# Patient Record
Sex: Female | Born: 1950 | Race: White | Hispanic: No | Marital: Married | Smoking: Former smoker
Health system: Southern US, Community
[De-identification: ages and names within clinical notes are randomized; demographics above are authoritative.]

## PROBLEM LIST (undated history)

## (undated) DIAGNOSIS — A419 Sepsis, unspecified organism: Secondary | ICD-10-CM

## (undated) DIAGNOSIS — J9620 Acute and chronic respiratory failure, unspecified whether with hypoxia or hypercapnia: Secondary | ICD-10-CM

## (undated) DIAGNOSIS — F329 Major depressive disorder, single episode, unspecified: Secondary | ICD-10-CM

## (undated) DIAGNOSIS — F32A Depression, unspecified: Secondary | ICD-10-CM

## (undated) DIAGNOSIS — J449 Chronic obstructive pulmonary disease, unspecified: Secondary | ICD-10-CM

## (undated) DIAGNOSIS — J9610 Chronic respiratory failure, unspecified whether with hypoxia or hypercapnia: Secondary | ICD-10-CM

## (undated) DIAGNOSIS — I10 Essential (primary) hypertension: Secondary | ICD-10-CM

## (undated) DIAGNOSIS — F419 Anxiety disorder, unspecified: Secondary | ICD-10-CM

## (undated) DIAGNOSIS — J189 Pneumonia, unspecified organism: Secondary | ICD-10-CM

## (undated) HISTORY — PX: ABDOMINAL HYSTERECTOMY: SHX81

---

## 2007-12-17 ENCOUNTER — Encounter: Payer: Self-pay | Admitting: Internal Medicine

## 2008-12-01 ENCOUNTER — Encounter: Payer: Self-pay | Admitting: Internal Medicine

## 2009-01-03 ENCOUNTER — Encounter: Payer: Self-pay | Admitting: Internal Medicine

## 2009-01-08 ENCOUNTER — Encounter: Payer: Self-pay | Admitting: Internal Medicine

## 2009-01-25 DIAGNOSIS — I10 Essential (primary) hypertension: Secondary | ICD-10-CM | POA: Insufficient documentation

## 2009-01-25 DIAGNOSIS — J441 Chronic obstructive pulmonary disease with (acute) exacerbation: Secondary | ICD-10-CM | POA: Insufficient documentation

## 2009-01-26 ENCOUNTER — Ambulatory Visit: Payer: Self-pay | Admitting: Internal Medicine

## 2009-03-07 ENCOUNTER — Ambulatory Visit: Payer: Self-pay | Admitting: Internal Medicine

## 2010-05-02 ENCOUNTER — Telehealth (INDEPENDENT_AMBULATORY_CARE_PROVIDER_SITE_OTHER): Payer: Self-pay | Admitting: *Deleted

## 2010-05-11 NOTE — Progress Notes (Signed)
  Phone Note Other Incoming   Request: Send information Summary of Call: Request for records received from [facility]. Request forwarded to [facility].     

## 2011-03-02 ENCOUNTER — Emergency Department (HOSPITAL_COMMUNITY)
Admission: EM | Admit: 2011-03-02 | Discharge: 2011-03-02 | Disposition: A | Discharge status: Home / Self Care | Attending: Emergency Medicine | Admitting: Emergency Medicine

## 2011-03-02 ENCOUNTER — Encounter: Payer: Self-pay | Admitting: Emergency Medicine

## 2011-03-02 DIAGNOSIS — J441 Chronic obstructive pulmonary disease with (acute) exacerbation: Secondary | ICD-10-CM

## 2011-03-02 DIAGNOSIS — R0602 Shortness of breath: Secondary | ICD-10-CM | POA: Insufficient documentation

## 2011-03-02 DIAGNOSIS — R0609 Other forms of dyspnea: Secondary | ICD-10-CM | POA: Insufficient documentation

## 2011-03-02 DIAGNOSIS — F17200 Nicotine dependence, unspecified, uncomplicated: Secondary | ICD-10-CM | POA: Insufficient documentation

## 2011-03-02 DIAGNOSIS — J3489 Other specified disorders of nose and nasal sinuses: Secondary | ICD-10-CM | POA: Insufficient documentation

## 2011-03-02 DIAGNOSIS — R093 Abnormal sputum: Secondary | ICD-10-CM | POA: Insufficient documentation

## 2011-03-02 DIAGNOSIS — R0989 Other specified symptoms and signs involving the circulatory and respiratory systems: Secondary | ICD-10-CM | POA: Insufficient documentation

## 2011-03-02 DIAGNOSIS — Z79899 Other long term (current) drug therapy: Secondary | ICD-10-CM | POA: Insufficient documentation

## 2011-03-02 DIAGNOSIS — R0682 Tachypnea, not elsewhere classified: Secondary | ICD-10-CM | POA: Insufficient documentation

## 2011-03-02 HISTORY — DX: Chronic obstructive pulmonary disease, unspecified: J44.9

## 2011-03-02 HISTORY — DX: Essential (primary) hypertension: I10

## 2011-03-02 MED ORDER — ALBUTEROL SULFATE (5 MG/ML) 0.5% IN NEBU
INHALATION_SOLUTION | RESPIRATORY_TRACT | Status: AC
  Filled 2011-03-02: qty 1

## 2011-03-02 MED ORDER — METHYLPREDNISOLONE SODIUM SUCC 125 MG IJ SOLR
INTRAMUSCULAR | Status: AC
  Filled 2011-03-02: qty 2

## 2011-03-02 NOTE — ED Notes (Signed)
Pt woke up sob this am and went to urgent care center. Was sent here by them for o2 sat in mid 80's. Pt received A&A tx x3 and Solumedrol 125mg  IV prior to arrival. Continues to wheeze and complain of sob. Denies pain.

## 2011-03-02 NOTE — ED Provider Notes (Signed)
Medical screening examination/treatment/procedure(s) were performed by non-physician practitioner and as supervising physician I was immediately available for consultation/collaboration.  Juliet Rude. Rubin Payor, MD 03/02/11 1918

## 2011-03-02 NOTE — ED Notes (Signed)
Placed on cardiac monitor. Sinus tach noted.

## 2011-03-02 NOTE — ED Provider Notes (Signed)
History     CSN: [id number] Arrival date & time: 03/02/2011  2:35 PM   First MD Initiated Contact with Patient 03/02/11 1501      Chief Complaint  Patient presents with  . Shortness of Breath    (Consider location/radiation/quality/duration/timing/severity/associated sxs/prior treatment) HPI History provided by pt.   Pt has h/o severe COPD.  Developed cough productive of clear sputum and exertional SOB yesterday.  Today SOB worsened and occurs at rest.  Associated w/ nasal congestion, rhinorrhea and sinus pressure.  Denies fever and CP.  Husband has had similar sx this week.  Pt transferred from [facility] because O2 sat 84-89% room air.  She is on 3L prn and at nighttime at home at O2 sat usually 94% on rm air.  Has had three breathing tmnts today and reports improvement in sx.  No RF for PE.  No known CAD.   Past Medical History  Diagnosis Date  . COPD (chronic obstructive pulmonary disease)   . Hypertension     Past Surgical History  Procedure Date  . Abdominal hysterectomy     No family history on file.  History  Substance Use Topics  . Smoking status: Current Some Day Smoker  . Smokeless tobacco: Not on file  . Alcohol Use: No    OB History    Grav Para Term Preterm Abortions TAB SAB Ect Mult Living                  Review of Systems  All other systems reviewed and are negative.    Allergies  Codeine and Morphine and related  Home Medications   Current Outpatient Rx  Name Route Sig Dispense Refill  . ALBUTEROL SULFATE (5 MG/ML) 0.5% IN NEBU Nebulization Take 2.5 mg by nebulization 3 (three) times daily as needed. For shortness of breath.     . ALPRAZOLAM 0.5 MG PO TABS Oral Take 0.5 mg by mouth 3 (three) times daily as needed. For anxiety.     . BUDESONIDE-FORMOTEROL FUMARATE 160-4.5 MCG/ACT IN AERO Inhalation Inhale 2 puffs into the lungs 2 (two) times daily.      Marland Kitchen FLUOXETINE HCL 20 MG PO CAPS Oral Take 40 mg by mouth every morning.      .  GUAIFENESIN 600 MG PO TB12 Oral Take 1,200 mg by mouth 2 (two) times daily.      . NEBIVOLOL HCL 5 MG PO TABS Oral Take 5 mg by mouth every evening.      . NYQUIL PO Oral Take 2 tablets by mouth at bedtime.      . DAYQUIL PO Oral Take 2 tablets by mouth every morning.      Marland Kitchen TIOTROPIUM BROMIDE MONOHYDRATE 18 MCG IN CAPS Inhalation Place 18 mcg into inhaler and inhale daily.        BP 113/51  Pulse 105  Temp(Src) 98.3 F (36.8 C) (Oral)  Resp 24  Ht 5\' 3"  (1.6 m)  Wt 160 lb (72.576 kg)  BMI 28.34 kg/m2  SpO2 93%  Physical Exam  Nursing note and vitals reviewed. Constitutional: She is oriented to person, place, and time. She appears well-developed and well-nourished. No distress.  HENT:  Head: Normocephalic and atraumatic.  Eyes:       Normal appearance  Neck: Normal range of motion.  Cardiovascular: Regular rhythm and normal heart sounds.        HR 109  Pulmonary/Chest: Effort normal.       Mild resp distress but able  to complete sentences.  Tachypnea; RR 25-30.  Breath sounds diminished on right.  Musculoskeletal: She exhibits no edema and no tenderness.  Neurological: She is alert and oriented to person, place, and time.  Skin: Skin is warm and dry. No rash noted.  Psychiatric: She has a normal mood and affect. Her behavior is normal.    ED Course  Procedures (including critical care time)  Date: 03/02/2011  Rate: 105  Rhythm: sinus tachycardia  QRS Axis: normal  Intervals: normal  ST/T Wave abnormalities: normal  Conduction Disutrbances:none  Narrative Interpretation:   Old EKG Reviewed: none available    Labs Reviewed  CBC  BASIC METABOLIC PANEL   No results found.   1. COPD exacerbation       MDM  Pt w/ h/o severe COPD presents w/ cough and SOB since yesterday.  Transferred from urgent care d/t O2 sats in 80s.  Has received 3 neb tmnts and reports that sx have improved.  On exam, afebrile, mild resp distress, tachycardic, tachypneic, diminished breath  sounds on right, no rales, no LE edema/tenderness.  O2 sat 94% currently which is reported to be baseline. EKG shows sinus tach which may be related to albuterol.  CXR at urgent care shows COPD exacerbation.  Will ambulate and admit if she becomes symptomatic or O2 sat drops.  3:33 PM   O2 went from 94 to 91% on rm air while pt ambulating.  She denies SOB and says that her breathing is much improved.  She would like to be discharged home to follow up with her own doctor.  Will monitor for another hour and d/c home if stable. 3:42 PM   Patient continues to feel well.  VSS.  Will d/c home.  Strict return precautions discussed. 4:16 PM        Otilio Miu, [place] 03/02/11 1616

## 2012-08-15 ENCOUNTER — Emergency Department (HOSPITAL_COMMUNITY)

## 2012-08-15 ENCOUNTER — Other Ambulatory Visit: Payer: Self-pay

## 2012-08-15 ENCOUNTER — Encounter (HOSPITAL_COMMUNITY): Payer: Self-pay | Admitting: Emergency Medicine

## 2012-08-15 ENCOUNTER — Emergency Department (HOSPITAL_COMMUNITY)
Admission: EM | Admit: 2012-08-15 | Discharge: 2012-08-15 | Disposition: A | Discharge status: Home / Self Care | Attending: Emergency Medicine | Admitting: Emergency Medicine

## 2012-08-15 DIAGNOSIS — J4489 Other specified chronic obstructive pulmonary disease: Secondary | ICD-10-CM | POA: Insufficient documentation

## 2012-08-15 DIAGNOSIS — Z9981 Dependence on supplemental oxygen: Secondary | ICD-10-CM | POA: Insufficient documentation

## 2012-08-15 DIAGNOSIS — J3489 Other specified disorders of nose and nasal sinuses: Secondary | ICD-10-CM | POA: Insufficient documentation

## 2012-08-15 DIAGNOSIS — R6889 Other general symptoms and signs: Secondary | ICD-10-CM | POA: Insufficient documentation

## 2012-08-15 DIAGNOSIS — Z79899 Other long term (current) drug therapy: Secondary | ICD-10-CM | POA: Insufficient documentation

## 2012-08-15 DIAGNOSIS — F17200 Nicotine dependence, unspecified, uncomplicated: Secondary | ICD-10-CM | POA: Insufficient documentation

## 2012-08-15 DIAGNOSIS — J449 Chronic obstructive pulmonary disease, unspecified: Secondary | ICD-10-CM | POA: Insufficient documentation

## 2012-08-15 DIAGNOSIS — IMO0001 Reserved for inherently not codable concepts without codable children: Secondary | ICD-10-CM

## 2012-08-15 DIAGNOSIS — I10 Essential (primary) hypertension: Secondary | ICD-10-CM | POA: Insufficient documentation

## 2012-08-15 LAB — BASIC METABOLIC PANEL WITH GFR
BUN: 15 mg/dL (ref 6–23)
CO2: 32 meq/L (ref 19–32)
Calcium: 9.3 mg/dL (ref 8.4–10.5)
Chloride: 99 meq/L (ref 96–112)
Creatinine, Ser: 0.88 mg/dL (ref 0.50–1.10)
GFR calc Af Amer: 79 mL/min — ABNORMAL LOW
GFR calc non Af Amer: 68 mL/min — ABNORMAL LOW
Glucose, Bld: 95 mg/dL (ref 70–99)
Potassium: 4.2 meq/L (ref 3.5–5.1)
Sodium: 139 meq/L (ref 135–145)

## 2012-08-15 LAB — CBC
HCT: 36.2 % (ref 36.0–46.0)
Hemoglobin: 11.9 g/dL — ABNORMAL LOW (ref 12.0–15.0)
MCHC: 32.9 g/dL (ref 30.0–36.0)
MCV: 91.4 fL (ref 78.0–100.0)
RDW: 12.2 % (ref 11.5–15.5)

## 2012-08-15 LAB — POCT I-STAT TROPONIN I: Troponin i, poc: 0 ng/mL (ref 0.00–0.08)

## 2012-08-15 MED ORDER — LEVOFLOXACIN IN D5W 750 MG/150ML IV SOLN
750.0000 mg | Freq: Once | INTRAVENOUS | Status: AC
  Administered 2012-08-15: 750 mg via INTRAVENOUS
  Filled 2012-08-15: qty 150

## 2012-08-15 MED ORDER — FLUTICASONE PROPIONATE 50 MCG/ACT NA SUSP: 2.0000 | Freq: Every day | NASAL | Status: DC

## 2012-08-15 MED ORDER — METHYLPREDNISOLONE SODIUM SUCC 125 MG IJ SOLR
125.0000 mg | Freq: Once | INTRAMUSCULAR | Status: AC
  Administered 2012-08-15: 125 mg via INTRAVENOUS
  Filled 2012-08-15: qty 2

## 2012-08-15 MED ORDER — PREDNISONE 50 MG PO TABS: 50.0000 mg | ORAL_TABLET | Freq: Every day | ORAL | Status: DC

## 2012-08-15 MED ORDER — MOXIFLOXACIN HCL 400 MG PO TABS: 400.0000 mg | ORAL_TABLET | Freq: Every day | ORAL | Status: DC

## 2012-08-15 NOTE — ED Notes (Signed)
Pt here for SOB and decreased O2 sat from PCP: pt tachyonic at present; pt sts hx of COPD and has been coughing and feels generally weak

## 2012-08-15 NOTE — ED Notes (Signed)
Patient transported to X-ray 

## 2012-08-15 NOTE — ED Provider Notes (Signed)
History     CSN: [id number]  Arrival date & time 08/15/12  1220   First MD Initiated Contact with Patient 08/15/12 1225      Chief Complaint  Patient presents with  . Shortness of Breath    (Consider location/radiation/quality/duration/timing/severity/associated sxs/prior treatment) HPI Pt presenting with mild cough productive of clear sputum as well as runny nose and sneezing for the past several days.  No fever/chills.  No increased shortness of breath from her baseline.  Pt uses 3L O2 at home.  No chest pain.  Was seen at her PMD office and found to have O2 sat of 82% per report.  Upon arrival to the ED, on only 2L of O2 her O2 sat is 97-100%.  She states she went to the PMD to obtain medication for her allergy symptoms of runny nose and sneezing.  There are no other associated systemic symptoms, there are no other alleviating or modifying factors.   Past Medical History  Diagnosis Date  . COPD (chronic obstructive pulmonary disease)   . Hypertension     Past Surgical History  Procedure Laterality Date  . Abdominal hysterectomy      History reviewed. No pertinent family history.  History  Substance Use Topics  . Smoking status: Current Some Day Smoker  . Smokeless tobacco: Not on file  . Alcohol Use: No    OB History   Grav Para Term Preterm Abortions TAB SAB Ect Mult Living                  Review of Systems ROS reviewed and all otherwise negative except for mentioned in HPI  Allergies  Codeine and Morphine and related  Home Medications   Current Outpatient Rx  Name  Route  Sig  Dispense  Refill  . albuterol (PROVENTIL) (5 MG/ML) 0.5% nebulizer solution   Nebulization   Take 2.5 mg by nebulization 3 (three) times daily as needed. For shortness of breath.          . ALPRAZolam (XANAX) 0.5 MG tablet   Oral   Take 0.5 mg by mouth 3 (three) times daily as needed. For anxiety.          . budesonide-formoterol (SYMBICORT) 160-4.5 MCG/ACT inhaler  Inhalation   Inhale 2 puffs into the lungs 2 (two) times daily.           Marland Kitchen FLUoxetine (PROZAC) 20 MG capsule   Oral   Take 40 mg by mouth every morning.           . fluticasone (FLONASE) 50 MCG/ACT nasal spray   Nasal   Place 2 sprays into the nose daily.   16 g   0   . guaiFENesin (MUCINEX) 600 MG 12 hr tablet   Oral   Take 1,200 mg by mouth 2 (two) times daily.           Marland Kitchen moxifloxacin (AVELOX) 400 MG tablet   Oral   Take 1 tablet (400 mg total) by mouth daily.   10 tablet   0   . nebivolol (BYSTOLIC) 5 MG tablet   Oral   Take 5 mg by mouth every evening.           . predniSONE (DELTASONE) 50 MG tablet   Oral   Take 1 tablet (50 mg total) by mouth daily.   4 tablet   0   . Pseudoeph-Doxylamine-DM-APAP (NYQUIL PO)   Oral   Take 2 tablets by mouth at bedtime.           Marland Kitchen  Pseudoephedrine-APAP-DM (DAYQUIL PO)   Oral   Take 2 tablets by mouth every morning.           . tiotropium (SPIRIVA) 18 MCG inhalation capsule   Inhalation   Place 18 mcg into inhaler and inhale daily.             BP 115/54  Pulse 83  Temp(Src) 98 F (36.7 C) (Oral)  Resp 24  SpO2 100% Vitals reviewed Physical Exam Physical Examination: General appearance - alert, well appearing, and in no distress Mental status - alert, oriented to person, place, and time Eyes - no conjunctival injection, no scleral icterus Mouth - mucous membranes moist, pharynx normal without lesions Chest - clear to auscultation, no wheezes, rales or rhonchi, symmetric air entry, no increased respiratory effort Heart - normal rate, regular rhythm, normal S1, S2, no murmurs, rubs, clicks or gallops Abdomen - soft, nontender, nondistended, no masses or organomegaly Extremities - peripheral pulses normal, no pedal edema, no clubbing or cyanosis Skin - normal coloration and turgor, no rashes  ED Course  Procedures (including critical care time)    Date: 08/15/2012  Rate: 81  Rhythm: normal sinus  rhythm  QRS Axis: normal  Intervals: normal  ST/T Wave abnormalities: normal  Conduction Disutrbances: none  Narrative Interpretation: unremarkable, no significant change since prior ekg of 03/02/11  3:26 PM pt updated about findings and plan.  She is happy to be discharged home and emphatically stated that she was very happy with her care in our ED.       Labs Reviewed  BASIC METABOLIC PANEL - Abnormal; Notable for the following:    GFR calc non Af Amer 68 (*)    GFR calc Af Amer 79 (*)    All other components within normal limits  CBC - Abnormal; Notable for the following:    Hemoglobin 11.9 (*)    All other components within normal limits  POCT I-STAT TROPONIN I   Dg Chest 2 View  08/15/2012  *RADIOLOGY REPORT*  Clinical Data: Shortness of breath  CHEST - 2 VIEW  Comparison: None.  Findings: The heart and pulmonary vascularity are within normal limits.  The lungs are hyperinflated without focal infiltrate.  Old rib fractures are noted on the right.  IMPRESSION: COPD without acute abnormality.   Original Report Authenticated By: Alcide Clever, M.D.      1. COPD bronchitis       MDM  Pt presenting with c/o runny nose, sneezing and cough productive of clear sputum.  Pt without evidence of hypoxia during observation in ED on 2L [place] and she states she uses 3L [place] at home.  Workup unrevealing.  Pt happy for discharge. Pt started on steroids and antibiotics to cover for mild copd exacerbation.   Discharged with strict return precautions.  Pt agreeable with plan.        Ethelda Chick, MD 08/16/12 1113

## 2012-11-07 DIAGNOSIS — F411 Generalized anxiety disorder: Secondary | ICD-10-CM | POA: Diagnosis not present

## 2012-11-07 DIAGNOSIS — Z23 Encounter for immunization: Secondary | ICD-10-CM | POA: Diagnosis not present

## 2012-11-07 DIAGNOSIS — J441 Chronic obstructive pulmonary disease with (acute) exacerbation: Secondary | ICD-10-CM | POA: Diagnosis not present

## 2012-12-04 DIAGNOSIS — F411 Generalized anxiety disorder: Secondary | ICD-10-CM | POA: Diagnosis not present

## 2012-12-04 DIAGNOSIS — I10 Essential (primary) hypertension: Secondary | ICD-10-CM | POA: Diagnosis not present

## 2012-12-04 DIAGNOSIS — J449 Chronic obstructive pulmonary disease, unspecified: Secondary | ICD-10-CM | POA: Diagnosis not present

## 2013-01-02 DIAGNOSIS — J449 Chronic obstructive pulmonary disease, unspecified: Secondary | ICD-10-CM | POA: Diagnosis not present

## 2013-01-02 DIAGNOSIS — F411 Generalized anxiety disorder: Secondary | ICD-10-CM | POA: Diagnosis not present

## 2013-01-30 DIAGNOSIS — J449 Chronic obstructive pulmonary disease, unspecified: Secondary | ICD-10-CM | POA: Diagnosis not present

## 2013-01-30 DIAGNOSIS — J329 Chronic sinusitis, unspecified: Secondary | ICD-10-CM | POA: Diagnosis not present

## 2013-01-30 DIAGNOSIS — F411 Generalized anxiety disorder: Secondary | ICD-10-CM | POA: Diagnosis not present

## 2013-03-02 DIAGNOSIS — F411 Generalized anxiety disorder: Secondary | ICD-10-CM | POA: Diagnosis not present

## 2013-03-02 DIAGNOSIS — I10 Essential (primary) hypertension: Secondary | ICD-10-CM | POA: Diagnosis not present

## 2013-03-02 DIAGNOSIS — J449 Chronic obstructive pulmonary disease, unspecified: Secondary | ICD-10-CM | POA: Diagnosis not present

## 2013-03-31 DIAGNOSIS — J441 Chronic obstructive pulmonary disease with (acute) exacerbation: Secondary | ICD-10-CM | POA: Diagnosis not present

## 2013-03-31 DIAGNOSIS — F411 Generalized anxiety disorder: Secondary | ICD-10-CM | POA: Diagnosis not present

## 2013-05-01 DIAGNOSIS — J441 Chronic obstructive pulmonary disease with (acute) exacerbation: Secondary | ICD-10-CM | POA: Diagnosis not present

## 2013-05-01 DIAGNOSIS — F411 Generalized anxiety disorder: Secondary | ICD-10-CM | POA: Diagnosis not present

## 2013-05-01 DIAGNOSIS — I10 Essential (primary) hypertension: Secondary | ICD-10-CM | POA: Diagnosis not present

## 2013-07-06 DIAGNOSIS — F411 Generalized anxiety disorder: Secondary | ICD-10-CM | POA: Diagnosis not present

## 2013-07-06 DIAGNOSIS — J441 Chronic obstructive pulmonary disease with (acute) exacerbation: Secondary | ICD-10-CM | POA: Diagnosis not present

## 2013-08-03 DIAGNOSIS — F411 Generalized anxiety disorder: Secondary | ICD-10-CM | POA: Diagnosis not present

## 2013-08-03 DIAGNOSIS — J449 Chronic obstructive pulmonary disease, unspecified: Secondary | ICD-10-CM | POA: Diagnosis not present

## 2013-09-08 DIAGNOSIS — J441 Chronic obstructive pulmonary disease with (acute) exacerbation: Secondary | ICD-10-CM | POA: Diagnosis not present

## 2013-09-08 DIAGNOSIS — F411 Generalized anxiety disorder: Secondary | ICD-10-CM | POA: Diagnosis not present

## 2013-11-04 DIAGNOSIS — Z1331 Encounter for screening for depression: Secondary | ICD-10-CM | POA: Diagnosis not present

## 2013-11-04 DIAGNOSIS — F411 Generalized anxiety disorder: Secondary | ICD-10-CM | POA: Diagnosis not present

## 2013-11-04 DIAGNOSIS — J4489 Other specified chronic obstructive pulmonary disease: Secondary | ICD-10-CM | POA: Diagnosis not present

## 2013-11-04 DIAGNOSIS — J449 Chronic obstructive pulmonary disease, unspecified: Secondary | ICD-10-CM | POA: Diagnosis not present

## 2013-11-04 DIAGNOSIS — I10 Essential (primary) hypertension: Secondary | ICD-10-CM | POA: Diagnosis not present

## 2013-11-04 DIAGNOSIS — Z9181 History of falling: Secondary | ICD-10-CM | POA: Diagnosis not present

## 2014-01-15 DIAGNOSIS — F419 Anxiety disorder, unspecified: Secondary | ICD-10-CM | POA: Diagnosis not present

## 2014-01-15 DIAGNOSIS — J449 Chronic obstructive pulmonary disease, unspecified: Secondary | ICD-10-CM | POA: Diagnosis not present

## 2014-01-15 DIAGNOSIS — Z029 Encounter for administrative examinations, unspecified: Secondary | ICD-10-CM | POA: Diagnosis not present

## 2014-02-04 DIAGNOSIS — I10 Essential (primary) hypertension: Secondary | ICD-10-CM | POA: Diagnosis not present

## 2014-02-04 DIAGNOSIS — F419 Anxiety disorder, unspecified: Secondary | ICD-10-CM | POA: Diagnosis not present

## 2014-02-04 DIAGNOSIS — J449 Chronic obstructive pulmonary disease, unspecified: Secondary | ICD-10-CM | POA: Diagnosis not present

## 2014-02-04 DIAGNOSIS — R440 Auditory hallucinations: Secondary | ICD-10-CM | POA: Diagnosis not present

## 2014-03-24 DIAGNOSIS — Z7409 Other reduced mobility: Secondary | ICD-10-CM | POA: Diagnosis not present

## 2014-03-24 DIAGNOSIS — Z9181 History of falling: Secondary | ICD-10-CM | POA: Diagnosis not present

## 2014-03-24 DIAGNOSIS — J449 Chronic obstructive pulmonary disease, unspecified: Secondary | ICD-10-CM | POA: Diagnosis not present

## 2014-03-24 DIAGNOSIS — F419 Anxiety disorder, unspecified: Secondary | ICD-10-CM | POA: Diagnosis not present

## 2014-04-06 DIAGNOSIS — Z9181 History of falling: Secondary | ICD-10-CM | POA: Diagnosis not present

## 2014-04-06 DIAGNOSIS — F419 Anxiety disorder, unspecified: Secondary | ICD-10-CM | POA: Diagnosis not present

## 2014-04-06 DIAGNOSIS — Z7409 Other reduced mobility: Secondary | ICD-10-CM | POA: Diagnosis not present

## 2014-04-06 DIAGNOSIS — J449 Chronic obstructive pulmonary disease, unspecified: Secondary | ICD-10-CM | POA: Diagnosis not present

## 2014-04-09 ENCOUNTER — Encounter (HOSPITAL_COMMUNITY): Payer: Self-pay | Admitting: Physical Medicine and Rehabilitation

## 2014-04-09 ENCOUNTER — Emergency Department (HOSPITAL_COMMUNITY): Payer: Medicare Other

## 2014-04-09 ENCOUNTER — Other Ambulatory Visit: Payer: Self-pay

## 2014-04-09 ENCOUNTER — Inpatient Hospital Stay (HOSPITAL_COMMUNITY)
Admission: EM | Admit: 2014-04-09 | Discharge: 2014-04-12 | DRG: 871 | Disposition: A | Discharge status: Home / Self Care | Payer: Medicare Other | Attending: Internal Medicine | Admitting: Internal Medicine

## 2014-04-09 DIAGNOSIS — D72821 Monocytosis (symptomatic): Secondary | ICD-10-CM | POA: Diagnosis present

## 2014-04-09 DIAGNOSIS — Z7951 Long term (current) use of inhaled steroids: Secondary | ICD-10-CM

## 2014-04-09 DIAGNOSIS — R112 Nausea with vomiting, unspecified: Secondary | ICD-10-CM | POA: Diagnosis not present

## 2014-04-09 DIAGNOSIS — Z87891 Personal history of nicotine dependence: Secondary | ICD-10-CM

## 2014-04-09 DIAGNOSIS — R4182 Altered mental status, unspecified: Secondary | ICD-10-CM | POA: Diagnosis not present

## 2014-04-09 DIAGNOSIS — Z90710 Acquired absence of both cervix and uterus: Secondary | ICD-10-CM

## 2014-04-09 DIAGNOSIS — A419 Sepsis, unspecified organism: Secondary | ICD-10-CM | POA: Diagnosis not present

## 2014-04-09 DIAGNOSIS — I248 Other forms of acute ischemic heart disease: Secondary | ICD-10-CM | POA: Diagnosis present

## 2014-04-09 DIAGNOSIS — J189 Pneumonia, unspecified organism: Secondary | ICD-10-CM | POA: Diagnosis present

## 2014-04-09 DIAGNOSIS — J441 Chronic obstructive pulmonary disease with (acute) exacerbation: Secondary | ICD-10-CM | POA: Diagnosis present

## 2014-04-09 DIAGNOSIS — R0602 Shortness of breath: Secondary | ICD-10-CM

## 2014-04-09 DIAGNOSIS — B349 Viral infection, unspecified: Secondary | ICD-10-CM | POA: Diagnosis present

## 2014-04-09 DIAGNOSIS — T7840XA Allergy, unspecified, initial encounter: Secondary | ICD-10-CM

## 2014-04-09 DIAGNOSIS — R079 Chest pain, unspecified: Secondary | ICD-10-CM | POA: Diagnosis present

## 2014-04-09 DIAGNOSIS — Z885 Allergy status to narcotic agent status: Secondary | ICD-10-CM

## 2014-04-09 DIAGNOSIS — I10 Essential (primary) hypertension: Secondary | ICD-10-CM | POA: Diagnosis present

## 2014-04-09 DIAGNOSIS — R748 Abnormal levels of other serum enzymes: Secondary | ICD-10-CM | POA: Diagnosis present

## 2014-04-09 DIAGNOSIS — Z7409 Other reduced mobility: Secondary | ICD-10-CM | POA: Diagnosis not present

## 2014-04-09 DIAGNOSIS — I4581 Long QT syndrome: Secondary | ICD-10-CM | POA: Diagnosis present

## 2014-04-09 DIAGNOSIS — J302 Other seasonal allergic rhinitis: Secondary | ICD-10-CM | POA: Diagnosis present

## 2014-04-09 DIAGNOSIS — R9431 Abnormal electrocardiogram [ECG] [EKG]: Secondary | ICD-10-CM | POA: Diagnosis present

## 2014-04-09 DIAGNOSIS — J9620 Acute and chronic respiratory failure, unspecified whether with hypoxia or hypercapnia: Secondary | ICD-10-CM | POA: Diagnosis present

## 2014-04-09 DIAGNOSIS — Z9981 Dependence on supplemental oxygen: Secondary | ICD-10-CM | POA: Diagnosis not present

## 2014-04-09 DIAGNOSIS — Z79899 Other long term (current) drug therapy: Secondary | ICD-10-CM | POA: Diagnosis not present

## 2014-04-09 DIAGNOSIS — D638 Anemia in other chronic diseases classified elsewhere: Secondary | ICD-10-CM | POA: Diagnosis present

## 2014-04-09 DIAGNOSIS — D649 Anemia, unspecified: Secondary | ICD-10-CM | POA: Diagnosis present

## 2014-04-09 DIAGNOSIS — F419 Anxiety disorder, unspecified: Secondary | ICD-10-CM | POA: Diagnosis present

## 2014-04-09 DIAGNOSIS — I4589 Other specified conduction disorders: Secondary | ICD-10-CM

## 2014-04-09 DIAGNOSIS — J9811 Atelectasis: Secondary | ICD-10-CM | POA: Diagnosis not present

## 2014-04-09 DIAGNOSIS — F329 Major depressive disorder, single episode, unspecified: Secondary | ICD-10-CM | POA: Diagnosis present

## 2014-04-09 DIAGNOSIS — J309 Allergic rhinitis, unspecified: Secondary | ICD-10-CM | POA: Diagnosis present

## 2014-04-09 DIAGNOSIS — R197 Diarrhea, unspecified: Secondary | ICD-10-CM | POA: Diagnosis present

## 2014-04-09 DIAGNOSIS — E8809 Other disorders of plasma-protein metabolism, not elsewhere classified: Secondary | ICD-10-CM | POA: Diagnosis present

## 2014-04-09 DIAGNOSIS — J984 Other disorders of lung: Secondary | ICD-10-CM | POA: Diagnosis not present

## 2014-04-09 LAB — RETICULOCYTES
RBC.: 3.59 MIL/uL — ABNORMAL LOW (ref 3.87–5.11)
Retic Count, Absolute: 39.5 10*3/uL (ref 19.0–186.0)
Retic Ct Pct: 1.1 % (ref 0.4–3.1)

## 2014-04-09 LAB — CBC
HCT: 29.5 % — ABNORMAL LOW (ref 36.0–46.0)
Hemoglobin: 9.6 g/dL — ABNORMAL LOW (ref 12.0–15.0)
MCH: 29 pg (ref 26.0–34.0)
MCHC: 32.5 g/dL (ref 30.0–36.0)
MCV: 89.1 fL (ref 78.0–100.0)
Platelets: 372 10*3/uL (ref 150–400)
RBC: 3.31 MIL/uL — ABNORMAL LOW (ref 3.87–5.11)
RDW: 12.1 % (ref 11.5–15.5)
WBC: 18.5 10*3/uL — ABNORMAL HIGH (ref 4.0–10.5)

## 2014-04-09 LAB — COMPREHENSIVE METABOLIC PANEL
ALT: 55 U/L — ABNORMAL HIGH (ref 0–35)
AST: 62 U/L — ABNORMAL HIGH (ref 0–37)
Albumin: 3.1 g/dL — ABNORMAL LOW (ref 3.5–5.2)
Alkaline Phosphatase: 77 U/L (ref 39–117)
Anion gap: 14 (ref 5–15)
BUN: 20 mg/dL (ref 6–23)
CO2: 26 mmol/L (ref 19–32)
Calcium: 9.7 mg/dL (ref 8.4–10.5)
Chloride: 95 mEq/L — ABNORMAL LOW (ref 96–112)
Creatinine, Ser: 0.81 mg/dL (ref 0.50–1.10)
GFR calc Af Amer: 86 mL/min — ABNORMAL LOW (ref >90)
GFR calc non Af Amer: 75 mL/min — ABNORMAL LOW (ref >90)
Glucose, Bld: 115 mg/dL — ABNORMAL HIGH (ref 70–99)
Potassium: 3.7 mmol/L (ref 3.5–5.1)
Sodium: 135 mmol/L (ref 135–145)
Total Bilirubin: 0.4 mg/dL (ref 0.3–1.2)
Total Protein: 7.2 g/dL (ref 6.0–8.3)

## 2014-04-09 LAB — I-STAT TROPONIN, ED: Troponin i, poc: 0.01 ng/mL (ref 0.00–0.08)

## 2014-04-09 LAB — LIPID PANEL
Cholesterol: 157 mg/dL (ref 0–200)
HDL: 58 mg/dL (ref >39)
LDL Cholesterol: 87 mg/dL (ref 0–99)
Total CHOL/HDL Ratio: 2.7 RATIO
Triglycerides: 58 mg/dL (ref <150)
VLDL: 12 mg/dL (ref 0–40)

## 2014-04-09 LAB — MAGNESIUM: Magnesium: 1.7 mg/dL (ref 1.5–2.5)

## 2014-04-09 LAB — PROTIME-INR
INR: 1.05 (ref 0.00–1.49)
Prothrombin Time: 13.8 seconds (ref 11.6–15.2)

## 2014-04-09 LAB — CREATININE, SERUM
Creatinine, Ser: 0.79 mg/dL (ref 0.50–1.10)
GFR calc Af Amer: >90 mL/min (ref >90)
GFR calc non Af Amer: 86 mL/min — ABNORMAL LOW (ref >90)

## 2014-04-09 LAB — APTT: aPTT: 37 seconds (ref 24–37)

## 2014-04-09 LAB — CBC WITH DIFFERENTIAL/PLATELET
Basophils Absolute: 0 10*3/uL (ref 0.0–0.1)
Basophils Relative: 0 % (ref 0–1)
Eosinophils Absolute: 0 10*3/uL (ref 0.0–0.7)
Eosinophils Relative: 0 % (ref 0–5)
HCT: 32.9 % — ABNORMAL LOW (ref 36.0–46.0)
Hemoglobin: 10.7 g/dL — ABNORMAL LOW (ref 12.0–15.0)
Lymphocytes Relative: 4 % — ABNORMAL LOW (ref 12–46)
Lymphs Abs: 0.8 10*3/uL (ref 0.7–4.0)
MCH: 29 pg (ref 26.0–34.0)
MCHC: 32.5 g/dL (ref 30.0–36.0)
MCV: 89.2 fL (ref 78.0–100.0)
Monocytes Absolute: 1.7 10*3/uL — ABNORMAL HIGH (ref 0.1–1.0)
Monocytes Relative: 8 % (ref 3–12)
Neutro Abs: 18.3 10*3/uL — ABNORMAL HIGH (ref 1.7–7.7)
Neutrophils Relative %: 88 % — ABNORMAL HIGH (ref 43–77)
Platelets: 390 10*3/uL (ref 150–400)
RBC: 3.69 MIL/uL — ABNORMAL LOW (ref 3.87–5.11)
RDW: 12 % (ref 11.5–15.5)
WBC: 20.8 10*3/uL — ABNORMAL HIGH (ref 4.0–10.5)

## 2014-04-09 LAB — TSH: TSH: 0.748 u[IU]/mL (ref 0.350–4.500)

## 2014-04-09 LAB — PROCALCITONIN: Procalcitonin: 0.51 ng/mL

## 2014-04-09 LAB — PHOSPHORUS: Phosphorus: 2.9 mg/dL (ref 2.3–4.6)

## 2014-04-09 LAB — BRAIN NATRIURETIC PEPTIDE: B Natriuretic Peptide: 120.6 pg/mL — ABNORMAL HIGH (ref 0.0–100.0)

## 2014-04-09 LAB — LACTATE DEHYDROGENASE: LDH: 226 U/L (ref 94–250)

## 2014-04-09 LAB — TROPONIN I: Troponin I: 0.08 ng/mL — ABNORMAL HIGH (ref <0.031)

## 2014-04-09 LAB — LACTIC ACID, PLASMA: Lactic Acid, Venous: 1.3 mmol/L (ref 0.5–2.2)

## 2014-04-09 LAB — TECHNOLOGIST SMEAR REVIEW

## 2014-04-09 MED ORDER — METOCLOPRAMIDE HCL 5 MG/ML IJ SOLN: 5.0000 mg | Freq: Four times a day (QID) | INTRAMUSCULAR | Status: DC | PRN

## 2014-04-09 MED ORDER — IPRATROPIUM-ALBUTEROL 0.5-2.5 (3) MG/3ML IN SOLN
3.0000 mL | RESPIRATORY_TRACT | Status: DC
  Administered 2014-04-09 – 2014-04-10 (×2): 3 mL via RESPIRATORY_TRACT
  Filled 2014-04-09 (×2): qty 3

## 2014-04-09 MED ORDER — ACETAMINOPHEN 325 MG PO TABS
650.0000 mg | ORAL_TABLET | Freq: Four times a day (QID) | ORAL | Status: DC | PRN
  Administered 2014-04-10 – 2014-04-11 (×2): 650 mg via ORAL
  Filled 2014-04-09 (×2): qty 2

## 2014-04-09 MED ORDER — SODIUM CHLORIDE 0.9 % IJ SOLN
3.0000 mL | Freq: Two times a day (BID) | INTRAMUSCULAR | Status: DC
  Administered 2014-04-09 – 2014-04-12 (×2): 3 mL via INTRAVENOUS

## 2014-04-09 MED ORDER — ALPRAZOLAM 0.5 MG PO TABS
0.5000 mg | ORAL_TABLET | Freq: Three times a day (TID) | ORAL | Status: DC | PRN
  Administered 2014-04-09 – 2014-04-11 (×3): 0.5 mg via ORAL
  Filled 2014-04-09 (×3): qty 1

## 2014-04-09 MED ORDER — SODIUM CHLORIDE 0.9 % IV BOLUS (SEPSIS): 1000.0000 mL | Freq: Once | INTRAVENOUS | Status: DC

## 2014-04-09 MED ORDER — FLUTICASONE PROPIONATE 50 MCG/ACT NA SUSP: 2.0000 | Freq: Every day | NASAL | Status: DC

## 2014-04-09 MED ORDER — VILAZODONE HCL 40 MG PO TABS
40.0000 mg | ORAL_TABLET | Freq: Every day | ORAL | Status: DC
  Administered 2014-04-10 – 2014-04-12 (×3): 40 mg via ORAL
  Filled 2014-04-09 (×3): qty 1

## 2014-04-09 MED ORDER — IPRATROPIUM-ALBUTEROL 0.5-2.5 (3) MG/3ML IN SOLN
3.0000 mL | Freq: Once | RESPIRATORY_TRACT | Status: AC
  Administered 2014-04-09: 3 mL via RESPIRATORY_TRACT
  Filled 2014-04-09: qty 3

## 2014-04-09 MED ORDER — BENZONATATE 100 MG PO CAPS
100.0000 mg | ORAL_CAPSULE | Freq: Two times a day (BID) | ORAL | Status: DC | PRN
Start: 2014-04-09 — End: 2014-04-12
  Administered 2014-04-09 – 2014-04-11 (×4): 100 mg via ORAL
  Filled 2014-04-09 (×4): qty 1

## 2014-04-09 MED ORDER — SODIUM CHLORIDE 0.9 % IV SOLN
INTRAVENOUS | Status: AC
  Administered 2014-04-09: 22:00:00 via INTRAVENOUS

## 2014-04-09 MED ORDER — ALBUTEROL SULFATE (2.5 MG/3ML) 0.083% IN NEBU: 5.0000 mg | INHALATION_SOLUTION | RESPIRATORY_TRACT | Status: DC | PRN

## 2014-04-09 MED ORDER — DM-GUAIFENESIN ER 30-600 MG PO TB12
1.0000 | ORAL_TABLET | Freq: Two times a day (BID) | ORAL | Status: DC
  Administered 2014-04-09 – 2014-04-12 (×6): 1 via ORAL
  Filled 2014-04-09 (×8): qty 1

## 2014-04-09 MED ORDER — CEFTRIAXONE SODIUM 1 G IJ SOLR
1.0000 g | INTRAMUSCULAR | Status: DC
  Administered 2014-04-09 – 2014-04-11 (×3): 1 g via INTRAVENOUS
  Filled 2014-04-09 (×4): qty 10

## 2014-04-09 MED ORDER — DOXYCYCLINE HYCLATE 100 MG PO TABS
100.0000 mg | ORAL_TABLET | Freq: Two times a day (BID) | ORAL | Status: DC
  Administered 2014-04-09 – 2014-04-12 (×6): 100 mg via ORAL
  Filled 2014-04-09 (×8): qty 1

## 2014-04-09 MED ORDER — LORATADINE 10 MG PO TABS
10.0000 mg | ORAL_TABLET | Freq: Every day | ORAL | Status: DC
  Administered 2014-04-10 – 2014-04-12 (×3): 10 mg via ORAL
  Filled 2014-04-09 (×4): qty 1

## 2014-04-09 MED ORDER — PREDNISONE 50 MG PO TABS
60.0000 mg | ORAL_TABLET | Freq: Every day | ORAL | Status: DC
  Administered 2014-04-10 – 2014-04-11 (×2): 60 mg via ORAL
  Filled 2014-04-09 (×4): qty 1

## 2014-04-09 MED ORDER — ACETAMINOPHEN 650 MG RE SUPP: 650.0000 mg | Freq: Four times a day (QID) | RECTAL | Status: DC | PRN

## 2014-04-09 MED ORDER — ENOXAPARIN SODIUM 40 MG/0.4ML ~~LOC~~ SOLN
40.0000 mg | Freq: Every day | SUBCUTANEOUS | Status: DC
  Administered 2014-04-09 – 2014-04-11 (×3): 40 mg via SUBCUTANEOUS
  Filled 2014-04-09 (×4): qty 0.4

## 2014-04-09 MED ORDER — METHYLPREDNISOLONE SODIUM SUCC 125 MG IJ SOLR
80.0000 mg | Freq: Once | INTRAMUSCULAR | Status: AC
  Administered 2014-04-09: 80 mg via INTRAVENOUS
  Filled 2014-04-09: qty 2

## 2014-04-09 NOTE — H&P (Signed)
Date: 04/09/2014               Patient Name:  Diamond Glass MRN: [id number]  DOB: 1951/01/31 Age / Sex: 64 y.o., female   PCP: [name], PA-C              Medical Service: Internal Medicine Teaching Service              Attending Physician: Dr. Inez Catalina, MD    First Contact: Dr. Vivianne Master Pager: [phone]  Second Contact: Dr. Mariea Clonts Pager: [phone]            After Hours (After 5p/  First Contact Pager: [phone]  weekends / holidays): Second Contact Pager: [phone]   Chief Complaint:  Shortness of breath.  History of Present Illness: Diamond Glass is a 64 year old woman with history of COPD and HTN presenting with shortness of breath.  She reports feeling "on top of the world" until 2 days ago when she woke up short of breath with a productive cough.  She has been coughing up green mucus without blood.  She denies chest pain, but says she was wheezing and felt like her chest was tight.  Over the last two days she has also developed nausea, non-bloody, non-bilious vomiting, and diarrhea.  Her PCP called in Phenergan, which she has been taking for the past couple of days with some relief of her symptoms. She says the her grandson is also sick with bronchitis.  She has been trying her home Xopenex inhaler without relief.  She also reports feeling anxious and being under a lot of stress.  She ran out of her home Xanax and was unable to take it last night.  She reports feeling warm, diaphoresis, and chills.  She denies congestion or sore throat. She denies lower extremity edema, orthopnea, or periods of decreased mobility recently.  She uses 4L O2 at baseline.  Review of Systems: Review of Systems  Constitutional: Positive for fever, chills, malaise/fatigue and diaphoresis. Negative for weight loss.  HENT: Negative for congestion and sore throat.   Eyes: Negative for blurred vision.  Respiratory: Positive for cough, sputum production, shortness of breath and wheezing. Negative for  hemoptysis.   Cardiovascular: Negative for chest pain, palpitations, orthopnea and leg swelling.  Gastrointestinal: Positive for nausea, vomiting and diarrhea. Negative for abdominal pain, constipation, blood in stool and melena.  Genitourinary: Negative for dysuria.  Musculoskeletal: Negative for myalgias and joint pain.  Skin: Negative for rash.  Neurological: Positive for dizziness and weakness. Negative for sensory change, focal weakness, loss of consciousness and headaches.    Meds:  (Not in a hospital admission) Current Facility-Administered Medications  Medication Dose Route Frequency Provider Last Rate Last Dose  . 0.9 %  sodium chloride infusion   Intravenous Continuous Marjan Rabbani, MD      . acetaminophen (TYLENOL) tablet 650 mg  650 mg Oral Q6H PRN Otis Brace, MD       Or  . acetaminophen (TYLENOL) suppository 650 mg  650 mg Rectal Q6H PRN Marjan Rabbani, MD      . albuterol (PROVENTIL) (2.5 MG/3ML) 0.083% nebulizer solution 5 mg  5 mg Nebulization Q2H PRN Marjan Rabbani, MD      . ALPRAZolam [name]) tablet 0.5 mg  0.5 mg Oral TID PRN Otis Brace, MD      . benzonatate (TESSALON) capsule 100 mg  100 mg Oral BID PRN Otis Brace, MD      . cefTRIAXone (ROCEPHIN)  1 g in dextrose 5 % 50 mL IVPB  1 g Intravenous Q24H Raeford Razor, MD   Stopped at 04/09/14 1906  . dextromethorphan-guaiFENesin (MUCINEX DM) 30-600 MG per 12 hr tablet 1 tablet  1 tablet Oral BID Marjan Rabbani, MD      . doxycycline (VIBRA-TABS) tablet 100 mg  100 mg Oral Q12H Raeford Razor, MD      . enoxaparin (LOVENOX) injection 40 mg  40 mg Subcutaneous Q24H Marjan Rabbani, MD      . fluticasone (FLONASE) 50 MCG/ACT nasal spray 2 spray  2 spray Each Nare Daily Marjan Rabbani, MD      . ipratropium-albuterol (DUONEB) 0.5-2.5 (3) MG/3ML nebulizer solution 3 mL  3 mL Nebulization Q4H Marjan Rabbani, MD   3 mL at 04/09/14 2029  . loratadine (CLARITIN) tablet 10 mg  10 mg Oral Daily Marjan Rabbani, MD        . metoCLOPramide (REGLAN) injection 5 mg  5 mg Intravenous Q6H PRN Otis Brace, MD      . Melene Muller ON 04/10/2014] predniSONE (DELTASONE) tablet 60 mg  60 mg Oral Daily Marjan Rabbani, MD      . sodium chloride 0.9 % bolus 1,000 mL  1,000 mL Intravenous Once Marjan Rabbani, MD      . Vilazodone HCl (VIIBRYD) TABS 40 mg  40 mg Oral Daily Otis Brace, MD       Current Outpatient Prescriptions  Medication Sig Dispense Refill  . albuterol (PROVENTIL HFA;VENTOLIN HFA) 108 (90 BASE) MCG/ACT inhaler Inhale 1-2 puffs into the lungs every 6 (six) hours as needed for wheezing or shortness of breath.    Marland Kitchen albuterol (PROVENTIL) (5 MG/ML) 0.5% nebulizer solution Take 2.5 mg by nebulization 3 (three) times daily as needed. For shortness of breath.     . ALPRAZolam (XANAX) 0.5 MG tablet Take 0.5 mg by mouth 3 (three) times daily as needed. For anxiety.     . Cetirizine HCl (ZYRTEC ALLERGY) 10 MG CAPS Take by mouth.    . Fluticasone Furoate-Vilanterol (BREO ELLIPTA) 100-25 MCG/INH AEPB Inhale 1 application into the lungs daily.    Marland Kitchen guaiFENesin (MUCINEX) 600 MG 12 hr tablet Take 1,200 mg by mouth 2 (two) times daily.      Marland Kitchen levalbuterol (XOPENEX HFA) 45 MCG/ACT inhaler Inhale 2 puffs into the lungs every 6 (six) hours as needed for wheezing.    Marland Kitchen lisinopril (PRINIVIL,ZESTRIL) 10 MG tablet Take 10 mg by mouth daily.    . Pseudoeph-Doxylamine-DM-APAP (NYQUIL PO) Take 2 tablets by mouth daily as needed. For flu and cold symptoms    . Pseudoephedrine-APAP-DM (DAYQUIL PO) Take 2 tablets by mouth daily as needed. For flu and cold symptoms    . tiotropium (SPIRIVA) 18 MCG inhalation capsule Place 18 mcg into inhaler and inhale daily.      . Vilazodone HCl (VIIBRYD) 40 MG TABS Take by mouth daily.    . budesonide-formoterol (SYMBICORT) 160-4.5 MCG/ACT inhaler Inhale 2 puffs into the lungs 2 (two) times daily.      . fluticasone (FLONASE) 50 MCG/ACT nasal spray Place 2 sprays into the nose daily. (Patient not taking:  Reported on 04/09/2014) 16 g 0  . nebivolol (BYSTOLIC) 5 MG tablet Take 5 mg by mouth every evening.        Allergies: Allergies as of 04/09/2014 - Review Complete 04/09/2014  Allergen Reaction Noted  . Codeine    . Morphine and related Other (See Comments) 03/02/2011   Past Medical History  Diagnosis Date  .  COPD (chronic obstructive pulmonary disease)   . Hypertension    Past Surgical History  Procedure Laterality Date  . Abdominal hysterectomy     No family history on file. History   Social History  . Marital Status: Married    Spouse Name: N/A    Number of Children: N/A  . Years of Education: N/A   Occupational History  . Not on file.   Social History Main Topics  . Smoking status: Former Smoker -- 4.00 packs/day for 50 years    Types: Cigarettes    Quit date: 04/09/2008  . Smokeless tobacco: Not on file  . Alcohol Use: No  . Drug Use: No  . Sexual Activity: Not on file   Other Topics Concern  . Not on file   Social History Narrative    Physical Exam: Filed Vitals:   04/09/14 1915  BP: 145/67  Pulse: 92  Temp: 99.1  Resp: 22  SpO2: 97% on 4L [place].  Physical Exam  Constitutional: She is oriented to person, place, and time and well-developed, well-nourished, and in no distress. No distress.  HENT:  Head: Normocephalic and atraumatic.  Mouth/Throat: No oropharyngeal exudate.  Eyes: Conjunctivae and EOM are normal. Pupils are equal, round, and reactive to light. No scleral icterus.  Neck: Normal range of motion. Neck supple.  Cardiovascular: Normal rate, regular rhythm and normal heart sounds.   Pulmonary/Chest: She is in respiratory distress. She has no wheezes. She has no rales. She exhibits no tenderness.  Increased work of breathing, poor air movement without wheezing.  Abdominal: Soft. Bowel sounds are normal. She exhibits no distension. There is no tenderness.  Musculoskeletal: Normal range of motion. She exhibits no edema or tenderness.    Lymphadenopathy:    She has no cervical adenopathy.  Neurological: She is alert and oriented to person, place, and time. No cranial nerve deficit. She exhibits normal muscle tone.  Skin: Skin is warm and dry. No rash noted. She is not diaphoretic. No erythema.    Lab results: Basic Metabolic Panel:  Recent Labs  78/29/56 1551  NA 135  K 3.7  CL 95*  CO2 26  GLUCOSE 115*  BUN 20  CREATININE 0.81  CALCIUM 9.7   Liver Function Tests:  Recent Labs  04/09/14 1551  AST 62*  ALT 55*  ALKPHOS 77  BILITOT 0.4  PROT 7.2  ALBUMIN 3.1*   CBC:  Recent Labs  04/09/14 1551  WBC 20.8*  NEUTROABS 18.3*  HGB 10.7*  HCT 32.9*  MCV 89.2  PLT 390    Imaging results:  Dg Chest 2 View  04/09/2014   CLINICAL DATA:  Short of breath.  Weakness.  EXAM: CHEST  2 VIEW  COMPARISON:  08/15/2012.  FINDINGS: Emphysema. Cardiopericardial silhouette is probably unchanged allowing for differences in projection. There is airspace density in the lateral RIGHT base abutting the pleural surface. This is not well seen on the lateral view and may be within the RIGHT lower lobe or RIGHT middle lobe. Configuration of the pulmonary hila unchanged compared to 2009. Basilar atelectasis is present on the LEFT. Aortic arch atherosclerosis.  IMPRESSION: Airspace disease in the lateral RIGHT base on the frontal view, most compatible with pneumonia. Followup in 4-6 weeks to ensure radiographic clearing and exclude an underlying lesion is recommended. Underlying emphysema.   Electronically Signed   By: Andreas Newport M.D.   On: 04/09/2014 16:23    Other results: EKG: sinus tachycardia, prolonged QT interval.  Assessment & Plan  by Problem: Principal Problem:   Community acquired pneumonia Active Problems:   Essential hypertension   COPD exacerbation   Chronic anemia   Anxiety and depression   Abnormal AST and ALT   History of tobacco abuse   Allergic rhinitis   #CAP with sepsis Opacities at R lung  base consistent with pneumonia. WBC elevated at 20.8, tachypnea, and tachycardic so 3/4 SIRS.  Satting reasonably well, but increased work of breathing.  No recent hospitalizations, so will treat for CAP.  Possible contribution of COPD to shortness of breath as well. Started on ceftriaxone and doxycycline in ER due to prolonged QT. -Admit to telemetry. -Continue ceftriaxone and doxycycline. -Check lactic acid. -1L NS bolus then NS at 100 ml/hr. -Tessalon perles. -Mucinex DM. -Check pro-BNP. -Step and legionella antigens. -Respiratory virus panel. -Sputum culture and gram stain. -Urinalysis.  #COPD exacerbation FEV1 38% in 2010, GOLD Stage 3, severe COPD.  On home oxygen. Exacerbation likely contributing to shortness of breath.  Received Solumedrol 80 mg in the ER. -Duonebs q4h. -Albuterol nebs q2h PRN. -Prednisone 60 mg daily. -Hold home Symbicort and Spiriva.  #Nausea, vomiting, diarrhea Likely part of viral syndrome or related to pneumonia. -Reglan 5 mg q6h PRN. -Rehydration as above.  #Hypertension BP borderline elevated in the ER. -Hold lisinopril and nebivolol in setting of sepsis.  #Monocytosis and anemia No signs of infectious mononucleosis. Will check smear to rule out leukemia. -Blood smear review. -Anemia panel. -FOBT.  #Prolonged QT Possibly related to recent use of Phenergan. Also on fluoxetine chronically. -Avoid QT prolonging medications. -Telemetry.  #Allergies -Continue home Flonase. -Claritin.  #Anxiety -Continue home Vibryd. -Continue home Xanax 0.5 TID PRN.  #DVT prophylaxis -Lovenox.  Dispo: Disposition is deferred at this time, awaiting improvement of current medical problems. Anticipated discharge in approximately 2-3 day(s).   The patient does have a current PCP ([place], PA-C), therefore will not be require OPC follow-up after discharge.   The patient does have transportation limitations that hinder transportation to clinic  appointments.   Signed:  Luisa Dago, MD, PhD PGY-1 Internal Medicine Teaching Service Pager: [phone] 04/09/2014, 8:59 PM

## 2014-04-09 NOTE — ED Notes (Signed)
Pt presents to department from PCP office for evaluation of L sided chest pain radiating to L arm. Onset Thursday evening. 8/10 pain upon arrival to ED. Respirations labored, skin dry. Pt is alert and oriented x4.

## 2014-04-09 NOTE — ED Provider Notes (Signed)
CSN: [id number]     Arrival date & time 04/09/14  1516 History   First MD Initiated Contact with Patient 04/09/14 1601     Chief Complaint  Patient presents with  . Chest Pain  . Shortness of Breath     (Consider location/radiation/quality/duration/timing/severity/associated sxs/prior Treatment) HPI   64 year old female with cough. Symptom onset 2 days ago when she woke up short of breath with a productive cough. She has been coughing up green mucus without blood. She denies chest pain, but says she was wheezing and felt like her chest was tight. Over the last two days she has also developed nausea, non-bloody, non-bilious vomiting, and diarrhea. Her PCP called in Phenergan, which she has been taking for the past couple of days with some relief of her symptoms. She says the her grandson is also sick with bronchitis. She has been trying her home Xopenex inhaler without relief. She also reports feeling anxious and being under a lot of stress. She ran out of her home Xanax and was unable to take it last night. She reports feeling warm, diaphoresis, and chills. She denies congestion or sore throat. She denies lower extremity edema, orthopnea, or periods of decreased mobility recently. She uses 4L O2 at baseline.   Past Medical History  Diagnosis Date  . COPD (chronic obstructive pulmonary disease)   . Hypertension    Past Surgical History  Procedure Laterality Date  . Abdominal hysterectomy     No family history on file. History  Substance Use Topics  . Smoking status: Former Games developer  . Smokeless tobacco: Not on file  . Alcohol Use: No   OB History    No data available     Review of Systems  All systems reviewed and negative, other than as noted in HPI.   Allergies  Codeine and Morphine and related  Home Medications   Prior to Admission medications   Medication Sig Start Date End Date Taking? Authorizing Provider  albuterol (PROVENTIL HFA;VENTOLIN HFA) 108 (90  BASE) MCG/ACT inhaler Inhale 1-2 puffs into the lungs every 6 (six) hours as needed for wheezing or shortness of breath.   Yes Historical Provider, MD  albuterol (PROVENTIL) (5 MG/ML) 0.5% nebulizer solution Take 2.5 mg by nebulization 3 (three) times daily as needed. For shortness of breath.    Yes Historical Provider, MD  ALPRAZolam [name]) 0.5 MG tablet Take 0.5 mg by mouth 3 (three) times daily as needed. For anxiety.    Yes Historical Provider, MD  Cetirizine HCl (ZYRTEC ALLERGY) 10 MG CAPS Take by mouth.   Yes Historical Provider, MD  FLUoxetine HCl 60 MG TABS Take 1 tablet by mouth daily.   Yes Historical Provider, MD  Fluticasone Furoate-Vilanterol (BREO ELLIPTA) 100-25 MCG/INH AEPB Inhale 1 application into the lungs daily.   Yes Historical Provider, MD  guaiFENesin (MUCINEX) 600 MG 12 hr tablet Take 1,200 mg by mouth 2 (two) times daily.     Yes Historical Provider, MD  levalbuterol [facility] HFA) 45 MCG/ACT inhaler Inhale 2 puffs into the lungs every 6 (six) hours as needed for wheezing.   Yes Historical Provider, MD  lisinopril (PRINIVIL,ZESTRIL) 10 MG tablet Take 10 mg by mouth daily.   Yes Historical Provider, MD  promethazine (PHENERGAN) 12.5 MG tablet Take 12.5 mg by mouth every 4 (four) hours as needed for nausea or vomiting.   Yes Historical Provider, MD  Pseudoeph-Doxylamine-DM-APAP (NYQUIL PO) Take 2 tablets by mouth daily as needed. For flu and cold symptoms  Yes Historical Provider, MD  Pseudoephedrine-APAP-DM (DAYQUIL PO) Take 2 tablets by mouth daily as needed. For flu and cold symptoms   Yes Historical Provider, MD  tiotropium (SPIRIVA) 18 MCG inhalation capsule Place 18 mcg into inhaler and inhale daily.     Yes Historical Provider, MD  Vilazodone HCl (VIIBRYD) 40 MG TABS Take by mouth daily.   Yes Historical Provider, MD  budesonide-formoterol (SYMBICORT) 160-4.5 MCG/ACT inhaler Inhale 2 puffs into the lungs 2 (two) times daily.      Historical Provider, MD  FLUoxetine  (PROZAC) 20 MG capsule Take 40 mg by mouth every morning.      Historical Provider, MD  fluticasone (FLONASE) 50 MCG/ACT nasal spray Place 2 sprays into the nose daily. Patient not taking: Reported on 04/09/2014 08/15/12   Ethelda Chick, MD  moxifloxacin (AVELOX) 400 MG tablet Take 1 tablet (400 mg total) by mouth daily. Patient not taking: Reported on 04/09/2014 08/15/12   Ethelda Chick, MD  nebivolol (BYSTOLIC) 5 MG tablet Take 5 mg by mouth every evening.      Historical Provider, MD  predniSONE (DELTASONE) 50 MG tablet Take 1 tablet (50 mg total) by mouth daily. Patient not taking: Reported on 04/09/2014 08/15/12   Ethelda Chick, MD   BP 141/61 mmHg  Pulse 102  Temp(Src) 99.1 F (37.3 C) (Oral)  Resp 22  Ht  (1.6 m)  Wt 133 lb (60.328 kg)  BMI 23.57 kg/m2  SpO2 83% Physical Exam  Constitutional: She appears well-developed and well-nourished. No distress.  HENT:  Head: Normocephalic and atraumatic.  Eyes: Conjunctivae are normal. Right eye exhibits no discharge. Left eye exhibits no discharge.  Neck: Neck supple.  Cardiovascular: Normal rate, regular rhythm and normal heart sounds.  Exam reveals no gallop and no friction rub.   No murmur heard. Pulmonary/Chest: Effort normal. No respiratory distress. She has wheezes.  Abdominal: Soft. She exhibits no distension. There is no tenderness.  Musculoskeletal: She exhibits no edema or tenderness.  Lower extremities symmetric as compared to each other. No calf tenderness. Negative Homan's. No palpable cords.   Neurological: She is alert.  Skin: Skin is warm and dry.  Psychiatric: She has a normal mood and affect. Her behavior is normal. Thought content normal.  Nursing note and vitals reviewed.   ED Course  Procedures (including critical care time) Labs Review Labs Reviewed  CBC WITH DIFFERENTIAL - Abnormal; Notable for the following:    WBC 20.8 (*)    RBC 3.69 (*)    Hemoglobin 10.7 (*)    HCT 32.9 (*)    Neutrophils  Relative % 88 (*)    Neutro Abs 18.3 (*)    Lymphocytes Relative 4 (*)    Monocytes Absolute 1.7 (*)    All other components within normal limits  COMPREHENSIVE METABOLIC PANEL  Rosezena Sensor, ED    Imaging Review Dg Chest 2 View  04/09/2014   CLINICAL DATA:  Short of breath.  Weakness.  EXAM: CHEST  2 VIEW  COMPARISON:  08/15/2012.  FINDINGS: Emphysema. Cardiopericardial silhouette is probably unchanged allowing for differences in projection. There is airspace density in the lateral RIGHT base abutting the pleural surface. This is not well seen on the lateral view and may be within the RIGHT lower lobe or RIGHT middle lobe. Configuration of the pulmonary hila unchanged compared to 2009. Basilar atelectasis is present on the LEFT. Aortic arch atherosclerosis.  IMPRESSION: Airspace disease in the lateral RIGHT base on the frontal view,  most compatible with pneumonia. Followup in 4-6 weeks to ensure radiographic clearing and exclude an underlying lesion is recommended. Underlying emphysema.   Electronically Signed   By: Andreas Newport M.D.   On: 04/09/2014 16:23     EKG Interpretation   Date/Time:  Friday April 09 2014 15:23:42 EST Ventricular Rate:  103 PR Interval:  122 QRS Duration: 88 QT Interval:  458 QTC Calculation: 599 R Axis:   84 Text Interpretation:   Critical Test Result: Long QTc Sinus tachycardia  Nonspecific ST abnormality Prolonged QT Abnormal ECG QT prolonged as  compared to prior Confirmed by CAMPOS  MD, KEVIN ([id number]) on 04/09/2014  3:34:55 PM      MDM   Final diagnoses:  SOB (shortness of breath)    63yF with cough, SOB, n/v and some confusion. CXR consistent with pneumonia. Will tx for CAP. EKG with significantly prolonged QTc. Macrolide avoided for this reason and alternatively ordered doxycycline. Of note, pt has been taking phenergan for the past two days for n/v. She is also regularly on fluoxetine. Pt has baseline o2 requirement of 4L. Oxygen sats are  fine on this but she has significantly increased WOB. With her age and underlying lung disease, I feel admission is prudent.     Raeford Razor, MD 04/13/14 [phone]

## 2014-04-10 ENCOUNTER — Other Ambulatory Visit: Payer: Self-pay

## 2014-04-10 DIAGNOSIS — E8809 Other disorders of plasma-protein metabolism, not elsewhere classified: Secondary | ICD-10-CM | POA: Diagnosis present

## 2014-04-10 DIAGNOSIS — R9431 Abnormal electrocardiogram [ECG] [EKG]: Secondary | ICD-10-CM | POA: Diagnosis present

## 2014-04-10 DIAGNOSIS — I248 Other forms of acute ischemic heart disease: Secondary | ICD-10-CM | POA: Diagnosis not present

## 2014-04-10 LAB — STREP PNEUMONIAE URINARY ANTIGEN: Strep Pneumo Urinary Antigen: NEGATIVE

## 2014-04-10 LAB — URINALYSIS, ROUTINE W REFLEX MICROSCOPIC
Bilirubin Urine: NEGATIVE
Glucose, UA: NEGATIVE mg/dL
Hgb urine dipstick: NEGATIVE
Ketones, ur: 15 mg/dL — AB
Leukocytes, UA: NEGATIVE
Nitrite: NEGATIVE
Protein, ur: NEGATIVE mg/dL
Specific Gravity, Urine: 1.018 (ref 1.005–1.030)
Urobilinogen, UA: 0.2 mg/dL (ref 0.0–1.0)
pH: 5.5 (ref 5.0–8.0)

## 2014-04-10 LAB — TROPONIN I: Troponin I: 0.03 ng/mL (ref <0.031)

## 2014-04-10 LAB — COMPREHENSIVE METABOLIC PANEL
ALT: 51 U/L — ABNORMAL HIGH (ref 0–35)
AST: 50 U/L — ABNORMAL HIGH (ref 0–37)
Albumin: 2.5 g/dL — ABNORMAL LOW (ref 3.5–5.2)
Alkaline Phosphatase: 58 U/L (ref 39–117)
Anion gap: 10 (ref 5–15)
BUN: 20 mg/dL (ref 6–23)
CO2: 26 mmol/L (ref 19–32)
Calcium: 8.9 mg/dL (ref 8.4–10.5)
Chloride: 99 mEq/L (ref 96–112)
Creatinine, Ser: 0.75 mg/dL (ref 0.50–1.10)
GFR calc Af Amer: >90 mL/min (ref >90)
GFR calc non Af Amer: 87 mL/min — ABNORMAL LOW (ref >90)
Glucose, Bld: 159 mg/dL — ABNORMAL HIGH (ref 70–99)
Potassium: 3.7 mmol/L (ref 3.5–5.1)
Sodium: 135 mmol/L (ref 135–145)
Total Bilirubin: 0.3 mg/dL (ref 0.3–1.2)
Total Protein: 6.1 g/dL (ref 6.0–8.3)

## 2014-04-10 LAB — CBC WITH DIFFERENTIAL/PLATELET
BASOS ABS: 0 10*3/uL (ref 0.0–0.1)
Basophils Relative: 0 % (ref 0–1)
Eosinophils Absolute: 0 10*3/uL (ref 0.0–0.7)
Eosinophils Relative: 0 % (ref 0–5)
HCT: 30.2 % — ABNORMAL LOW (ref 36.0–46.0)
Hemoglobin: 9.8 g/dL — ABNORMAL LOW (ref 12.0–15.0)
Lymphocytes Relative: 2 % — ABNORMAL LOW (ref 12–46)
Lymphs Abs: 0.4 10*3/uL — ABNORMAL LOW (ref 0.7–4.0)
MCH: 28.9 pg (ref 26.0–34.0)
MCHC: 32.5 g/dL (ref 30.0–36.0)
MCV: 89.1 fL (ref 78.0–100.0)
Monocytes Absolute: 0.3 10*3/uL (ref 0.1–1.0)
Monocytes Relative: 2 % — ABNORMAL LOW (ref 3–12)
Neutro Abs: 15.7 10*3/uL — ABNORMAL HIGH (ref 1.7–7.7)
Neutrophils Relative %: 96 % — ABNORMAL HIGH (ref 43–77)
Platelets: 313 10*3/uL (ref 150–400)
RBC: 3.39 MIL/uL — ABNORMAL LOW (ref 3.87–5.11)
RDW: 12.1 % (ref 11.5–15.5)
WBC: 16.4 10*3/uL — ABNORMAL HIGH (ref 4.0–10.5)

## 2014-04-10 LAB — MRSA PCR SCREENING: MRSA by PCR: NEGATIVE

## 2014-04-10 LAB — EXPECTORATED SPUTUM ASSESSMENT W REFEX TO RESP CULTURE

## 2014-04-10 LAB — LIPASE, BLOOD: Lipase: 18 U/L (ref 11–59)

## 2014-04-10 LAB — EXPECTORATED SPUTUM ASSESSMENT W GRAM STAIN, RFLX TO RESP C: Special Requests: NORMAL

## 2014-04-10 MED ORDER — CETYLPYRIDINIUM CHLORIDE 0.05 % MT LIQD
7.0000 mL | Freq: Two times a day (BID) | OROMUCOSAL | Status: DC
  Administered 2014-04-10 – 2014-04-11 (×4): 7 mL via OROMUCOSAL

## 2014-04-10 MED ORDER — BOOST / RESOURCE BREEZE PO LIQD
1.0000 | Freq: Three times a day (TID) | ORAL | Status: DC
  Administered 2014-04-10 – 2014-04-11 (×4): 1 via ORAL

## 2014-04-10 MED ORDER — IPRATROPIUM-ALBUTEROL 0.5-2.5 (3) MG/3ML IN SOLN
3.0000 mL | Freq: Three times a day (TID) | RESPIRATORY_TRACT | Status: DC
  Administered 2014-04-10 – 2014-04-12 (×6): 3 mL via RESPIRATORY_TRACT
  Filled 2014-04-10 (×7): qty 3

## 2014-04-10 MED ORDER — PROMETHAZINE HCL 25 MG/ML IJ SOLN: 12.5000 mg | Freq: Four times a day (QID) | INTRAMUSCULAR | Status: DC | PRN

## 2014-04-10 NOTE — Progress Notes (Signed)
INITIAL NUTRITION ASSESSMENT  DOCUMENTATION CODES Per approved criteria  -Not Applicable   INTERVENTION: Agree with Resource Breeze po TID at least until pt is consuming 50-75% of meals, each supplement provides 250 kcal and 9 grams of protein   NUTRITION DIAGNOSIS: Inadequate oral intake related to pneumonia  as evidenced by pt reported decreased appetite and food intake. She has experienced 12% weight loss over past year  Goal: Pt will increase meal intake to meet >90% of needs  Monitor:  Po intake, labs and wt trends   Reason for Assessment: Malnutrition Screen   64 y.o. female  Admitting Dx: Community acquired pneumonia  ASSESSMENT: Pt presents with Community acquired pneumonia, COPD exacerbation, Nausea, vomiting and diarrhea. Her diet has been advanced and she is tolerating clears and say's she's feeling hungry. Pt reports increased stress lately due to family issues with daughter. Nutrition focused exam deferred at this time.  Height: Ht Readings from Last 1 Encounters:  04/10/14  (1.626 m)    Weight: Wt Readings from Last 1 Encounters:  04/10/14 132 lb 4.8 oz (60.011 kg)    Ideal Body Weight: 120#  % Ideal Body Weight: 110%  Wt Readings from Last 10 Encounters:  04/10/14 132 lb 4.8 oz (60.011 kg)  03/02/11 160 lb (72.576 kg)  03/07/09 175 lb 2.1 oz (79.439 kg)  01/26/09 184 lb 4 oz (83.575 kg)    Usual Body Weight: 150-160# past year  % Usual Body Weight: 88%  BMI:  Body mass index is 22.7 kg/(m^2). opverweight  Estimated Nutritional Needs: Kcal: 1500-1700 Protein: 72-85 gr Fluid: 1.5-1.7 liters daily  Skin: intact  Diet Order: Diet clear liquid  EDUCATION NEEDS: -No education needs identified at this time   Intake/Output Summary (Last 24 hours) at 04/10/14 1702 Last data filed at 04/10/14 0000  Gross per 24 hour  Intake    478 ml  Output      0 ml  Net    478 ml    Last BM: PTA  Labs:   Recent Labs Lab 04/09/14 1551  04/09/14 1959 04/09/14 2034 04/10/14 0411  NA 135  --   --  135  K 3.7  --   --  3.7  CL 95*  --   --  99  CO2 26  --   --  26  BUN 20  --   --  20  CREATININE 0.81  --  0.79 0.75  CALCIUM 9.7  --   --  8.9  MG  --  1.7  --   --   PHOS  --  2.9  --   --   GLUCOSE 115*  --   --  159*    CBG (last 3)  No results for input(s): GLUCAP in the last 72 hours.  Scheduled Meds: . antiseptic oral rinse  7 mL Mouth Rinse BID  . cefTRIAXone (ROCEPHIN)  IV  1 g Intravenous Q24H  . dextromethorphan-guaiFENesin  1 tablet Oral BID  . doxycycline  100 mg Oral Q12H  . enoxaparin (LOVENOX) injection  40 mg Subcutaneous QHS  . feeding supplement (RESOURCE BREEZE)  1 Container Oral TID BM  . ipratropium-albuterol  3 mL Nebulization TID  . loratadine  10 mg Oral Daily  . predniSONE  60 mg Oral Daily  . sodium chloride  1,000 mL Intravenous Once  . sodium chloride  3 mL Intravenous Q12H  . Vilazodone HCl  40 mg Oral Daily    Continuous Infusions:  Past Medical History  Diagnosis Date  . COPD (chronic obstructive pulmonary disease)   . Hypertension     Past Surgical History  Procedure Laterality Date  . Abdominal hysterectomy      Royann Shivers MS,RD,CSG,LDN Office: #[phone] Pager: [phone]

## 2014-04-10 NOTE — Plan of Care (Signed)
Problem: Phase I Progression Outcomes Goal: OOB as tolerated unless otherwise ordered Outcome: Progressing Up in room with walker. Became slightly SOB. Afraid of falling.

## 2014-04-10 NOTE — Progress Notes (Signed)
Subjective: Pt has no complaints this morning, states she is just feeling tired.  Objective: Vital signs in last 24 hours: Filed Vitals:   04/09/14 2105 04/09/14 2155 04/10/14 0003 04/10/14 0507  BP:  149/66  146/76  Pulse: 93 90  90  Temp: 97.8 F (36.6 C) 97.6 F (36.4 C)  98.6 F (37 C)  TempSrc: Oral Oral  Oral  Resp: 22   24  Height:     (1.626 m)  Weight:    132 lb 4.8 oz (60.011 kg)  SpO2: 96% 95% 95% 100%   Weight change:   Intake/Output Summary (Last 24 hours) at 04/10/14 0802 Last data filed at 04/10/14 0000  Gross per 24 hour  Intake    478 ml  Output      0 ml  Net    478 ml   General: NAD HEENT: moist mucous membranes Lungs: CTAB, no wheezes Cardiac: RRR, no murmurs GI: active bowel sounds, soft, non tender to palpation Ext: no pedal edema Neuro: CN 2-12 grossly intact  Lab Results: Basic Metabolic Panel:  Recent Labs Lab 04/09/14 1551 04/09/14 1959 04/09/14 2034 04/10/14 0411  NA 135  --   --  135  K 3.7  --   --  3.7  CL 95*  --   --  99  CO2 26  --   --  26  GLUCOSE 115*  --   --  159*  BUN 20  --   --  20  CREATININE 0.81  --  0.79 0.75  CALCIUM 9.7  --   --  8.9  MG  --  1.7  --   --   PHOS  --  2.9  --   --    Liver Function Tests:  Recent Labs Lab 04/09/14 1551 04/10/14 0411  AST 62* 50*  ALT 55* 51*  ALKPHOS 77 58  BILITOT 0.4 0.3  PROT 7.2 6.1  ALBUMIN 3.1* 2.5*   CBC:  Recent Labs Lab 04/09/14 1551 04/09/14 2034 04/10/14 0411  WBC 20.8* 18.5* 16.4*  NEUTROABS 18.3*  --  15.7*  HGB 10.7* 9.6* 9.8*  HCT 32.9* 29.5* 30.2*  MCV 89.2 89.1 89.1  PLT 390 372 313   Cardiac Enzymes:  Recent Labs Lab 04/09/14 2034 04/10/14 0411  TROPONINI 0.08* 0.03   Fasting Lipid Panel:  Recent Labs Lab 04/09/14 1959  CHOL 157  HDL 58  LDLCALC 87  TRIG 58  CHOLHDL 2.7   Thyroid Function Tests:  Recent Labs Lab 04/09/14 1959  TSH 0.748   Coagulation:  Recent Labs Lab 04/09/14 1959  LABPROT 13.8    INR 1.05   Anemia Panel:  Recent Labs Lab 04/09/14 1959  RETICCTPCT 1.1   Urinalysis:  Recent Labs Lab 04/10/14 0647  COLORURINE YELLOW  LABSPEC 1.018  PHURINE 5.5  GLUCOSEU NEGATIVE  HGBUR NEGATIVE  BILIRUBINUR NEGATIVE  KETONESUR 15*  PROTEINUR NEGATIVE  UROBILINOGEN 0.2  NITRITE NEGATIVE  LEUKOCYTESUR NEGATIVE   Studies/Results: Dg Chest 2 View  04/09/2014   CLINICAL DATA:  Short of breath.  Weakness.  EXAM: CHEST  2 VIEW  COMPARISON:  08/15/2012.  FINDINGS: Emphysema. Cardiopericardial silhouette is probably unchanged allowing for differences in projection. There is airspace density in the lateral RIGHT base abutting the pleural surface. This is not well seen on the lateral view and may be within the RIGHT lower lobe or RIGHT middle lobe. Configuration of the pulmonary hila unchanged compared to 2009. Basilar atelectasis is present  on the LEFT. Aortic arch atherosclerosis.  IMPRESSION: Airspace disease in the lateral RIGHT base on the frontal view, most compatible with pneumonia. Followup in 4-6 weeks to ensure radiographic clearing and exclude an underlying lesion is recommended. Underlying emphysema.   Electronically Signed   By: Andreas Newport M.D.   On: 04/09/2014 16:23   Medications: I have reviewed the patient's current medications. Scheduled Meds: . antiseptic oral rinse  7 mL Mouth Rinse BID  . cefTRIAXone (ROCEPHIN)  IV  1 g Intravenous Q24H  . dextromethorphan-guaiFENesin  1 tablet Oral BID  . doxycycline  100 mg Oral Q12H  . enoxaparin (LOVENOX) injection  40 mg Subcutaneous QHS  . ipratropium-albuterol  3 mL Nebulization TID  . loratadine  10 mg Oral Daily  . predniSONE  60 mg Oral Daily  . sodium chloride  1,000 mL Intravenous Once  . sodium chloride  3 mL Intravenous Q12H  . Vilazodone HCl  40 mg Oral Daily   Continuous Infusions:  PRN Meds:.acetaminophen **OR** acetaminophen, albuterol, ALPRAZolam, benzonatate, metoCLOPramide (REGLAN)  injection Assessment/Plan: Principal Problem:   Community acquired pneumonia Active Problems:   Essential hypertension   COPD exacerbation   Chronic anemia   Anxiety and depression   Abnormal AST and ALT   History of tobacco abuse   Allergic rhinitis   Nausea & vomiting   Diarrhea   Oxygen dependent   Prolonged QT interval   Demand ischemia   Hypoalbuminemia   CAP with sepsis -- WBC trending down from 20.8 on admission to 16.4 today. Lactic acid 1.3, LDH and procalcitonin WNL  -Continue ceftriaxone and doxycycline day 2 -Tessalon pearls and mucinex DM. -pro-BNP mildly elevated at 120.6, no crackles on PE this morning and no edema. Does not appear volume overloaded.  -Step and legionella antigens pending -Respiratory virus panel ordered -Sputum culture and gram stain needs to be collected  COPD exacerbation--GOLD Stage 3, severe COPD.  -Duonebs q4h. -Albuterol nebs q2h PRN. -Prednisone 60 mg daily, day 2 -Hold home Symbicort and Spiriva.  Nausea, vomiting, diarrhea--Likely viral, will continue to monitor -Reglan 5 mg q6h PRN.  Hypertension--normotensive  -Holding home lisinopril and nebivolol, can restart as needed  Anemia-- Hgb 11.9 in 08/2012 -Anemia panel penidng -FOBT ordered  Prolonged QT Possibly related to recent use of Phenergan. Also on fluoxetine chronically. -Avoid QT prolonging medications. -Telemetry, EKG ordered this am. If WNL can d/c tele  Seasonal allergies -Continue home Flonase. -Claritin  daily  Anxiety -Continue home Vibryd. -Continue home Xanax 0.5 TID PRN.  DVT prophylaxis -Lovenox.  Dispo: Disposition is deferred at this time, awaiting improvement of current medical problems. Anticipated discharge in approximately 1-2 day(s).   The patient does have a current PCP ([place], PA-C), therefore will not be require OPC follow-up after discharge.   The patient does have transportation limitations that hinder transportation to  clinic appointments.   .Services Needed at time of discharge: Y = Yes, Blank = No PT:   OT:   RN:   Equipment:   Other:     LOS: 1 day   Gara Kroner, MD 04/10/2014, 8:02 AM

## 2014-04-11 LAB — CBC WITH DIFFERENTIAL/PLATELET
BASOS ABS: 0 10*3/uL (ref 0.0–0.1)
Basophils Relative: 0 % (ref 0–1)
Eosinophils Absolute: 0 10*3/uL (ref 0.0–0.7)
Eosinophils Relative: 0 % (ref 0–5)
HEMATOCRIT: 28.6 % — AB (ref 36.0–46.0)
HEMOGLOBIN: 9.1 g/dL — AB (ref 12.0–15.0)
Lymphocytes Relative: 5 % — ABNORMAL LOW (ref 12–46)
Lymphs Abs: 1 10*3/uL (ref 0.7–4.0)
MCH: 28.3 pg (ref 26.0–34.0)
MCHC: 31.8 g/dL (ref 30.0–36.0)
MCV: 88.8 fL (ref 78.0–100.0)
MONO ABS: 1.1 10*3/uL — AB (ref 0.1–1.0)
Monocytes Relative: 6 % (ref 3–12)
NEUTROS ABS: 17.9 10*3/uL — AB (ref 1.7–7.7)
Neutrophils Relative %: 89 % — ABNORMAL HIGH (ref 43–77)
PLATELETS: 389 10*3/uL (ref 150–400)
RBC: 3.22 MIL/uL — ABNORMAL LOW (ref 3.87–5.11)
RDW: 12.1 % (ref 11.5–15.5)
WBC: 20 10*3/uL — ABNORMAL HIGH (ref 4.0–10.5)

## 2014-04-11 LAB — IRON AND TIBC
Iron: <10 ug/dL — ABNORMAL LOW (ref 42–145)
UIBC: 243 ug/dL (ref 125–400)

## 2014-04-11 LAB — HEMOGLOBIN A1C
Hgb A1c MFr Bld: 5.2 % (ref <5.7)
Mean Plasma Glucose: 103 mg/dL (ref <117)

## 2014-04-11 LAB — FOLATE: Folate: 11.2 ng/mL

## 2014-04-11 LAB — HEPATITIS PANEL, ACUTE
HCV Ab: NEGATIVE
Hep A IgM: NONREACTIVE
Hep B C IgM: NONREACTIVE
Hepatitis B Surface Ag: NEGATIVE

## 2014-04-11 LAB — FERRITIN: Ferritin: 243 ng/mL (ref 10–291)

## 2014-04-11 LAB — VITAMIN B12: Vitamin B-12: 345 pg/mL (ref 211–911)

## 2014-04-11 LAB — HIV ANTIBODY (ROUTINE TESTING W REFLEX): HIV 1&2 Ab, 4th Generation: NONREACTIVE

## 2014-04-11 MED ORDER — LISINOPRIL 10 MG PO TABS
10.0000 mg | ORAL_TABLET | Freq: Every day | ORAL | Status: DC
  Administered 2014-04-11 – 2014-04-12 (×2): 10 mg via ORAL
  Filled 2014-04-11 (×2): qty 1

## 2014-04-11 MED ORDER — PANTOPRAZOLE SODIUM 20 MG PO TBEC
20.0000 mg | DELAYED_RELEASE_TABLET | Freq: Every day | ORAL | Status: DC
  Administered 2014-04-11 – 2014-04-12 (×2): 20 mg via ORAL
  Filled 2014-04-11 (×2): qty 1

## 2014-04-11 MED ORDER — PREDNISONE 20 MG PO TABS
40.0000 mg | ORAL_TABLET | Freq: Every day | ORAL | Status: DC
  Administered 2014-04-12: 40 mg via ORAL
  Filled 2014-04-11: qty 2

## 2014-04-11 NOTE — Progress Notes (Signed)
Patient ambulated in hallway standby assist, front wheel rolling walker and oxygen. Patient ambulated 150 ft.  O2 sats 99% on 3L at rest O2 sats 91% on 3L with ambulation O2 sats 93% on 4L with ambulation O2 sats 97% on 4L at rest back in room following ambulation.  Hermina Barters, RN

## 2014-04-11 NOTE — Progress Notes (Signed)
Subjective: Pt feels much better today, appears comfortable. She is on 3L of O2 by nasal canula, at home she uses 4L. She appears anxious but this normal for her.   Objective: Vital signs in last 24 hours: Filed Vitals:   04/10/14 2122 04/11/14 0501 04/11/14 0759 04/11/14 1531  BP: 162/74 148/77  139/78  Pulse: 87 84  92  Temp: 97.2 F (36.2 C) 98.3 F (36.8 C)  98.3 F (36.8 C)  TempSrc: Oral Oral  Oral  Resp: Height:      Weight:      SpO2: 98% 99% 99% 100%   Weight change:  No intake or output data in the 24 hours ending 04/11/14 1753 General: NAD HEENT: moist mucous membranes Lungs: CTAB, no wheezes or crackles.  Cardiac: RRR, no murmurs GI: active bowel sounds, soft, non tender to palpation, had just eaten breakfast. Ext: no pedal edema Neuro: CN 2-12 grossly intact  Lab Results: Basic Metabolic Panel:  Recent Labs Lab 04/09/14 1551 04/09/14 1959 04/09/14 2034 04/10/14 0411  NA 135  --   --  135  K 3.7  --   --  3.7  CL 95*  --   --  99  CO2 26  --   --  26  GLUCOSE 115*  --   --  159*  BUN 20  --   --  20  CREATININE 0.81  --  0.79 0.75  CALCIUM 9.7  --   --  8.9  MG  --  1.7  --   --   PHOS  --  2.9  --   --    Liver Function Tests:  Recent Labs Lab 04/09/14 1551 04/10/14 0411  AST 62* 50*  ALT 55* 51*  ALKPHOS 77 58  BILITOT 0.4 0.3  PROT 7.2 6.1  ALBUMIN 3.1* 2.5*   CBC:  Recent Labs Lab 04/10/14 0411 04/11/14 0603  WBC 16.4* 20.0*  NEUTROABS 15.7* 17.9*  HGB 9.8* 9.1*  HCT 30.2* 28.6*  MCV 89.1 88.8  PLT 313 389   Cardiac Enzymes:  Recent Labs Lab 04/09/14 2034 04/10/14 0411  TROPONINI 0.08* 0.03   Fasting Lipid Panel:  Recent Labs Lab 04/09/14 1959  CHOL 157  HDL 58  LDLCALC 87  TRIG 58  CHOLHDL 2.7   Thyroid Function Tests:  Recent Labs Lab 04/09/14 1959  TSH 0.748   Coagulation:  Recent Labs Lab 04/09/14 1959  LABPROT 13.8  INR 1.05   Anemia Panel:  Recent Labs Lab  04/09/14 1959  VITAMINB12 345  FOLATE 11.2  FERRITIN 243  TIBC Not calculated due to Iron <10.  IRON <10*  RETICCTPCT 1.1   Urinalysis:  Recent Labs Lab 04/10/14 0647  COLORURINE YELLOW  LABSPEC 1.018  PHURINE 5.5  GLUCOSEU NEGATIVE  HGBUR NEGATIVE  BILIRUBINUR NEGATIVE  KETONESUR 15*  PROTEINUR NEGATIVE  UROBILINOGEN 0.2  NITRITE NEGATIVE  LEUKOCYTESUR NEGATIVE   Studies/Results: No results found. Medications: I have reviewed the patient's current medications. Scheduled Meds: . antiseptic oral rinse  7 mL Mouth Rinse BID  . cefTRIAXone (ROCEPHIN)  IV  1 g Intravenous Q24H  . dextromethorphan-guaiFENesin  1 tablet Oral BID  . doxycycline  100 mg Oral Q12H  . enoxaparin (LOVENOX) injection  40 mg Subcutaneous QHS  . feeding supplement (RESOURCE BREEZE)  1 Container Oral TID BM  . ipratropium-albuterol  3 mL Nebulization TID  . loratadine  10 mg Oral Daily  . pantoprazole  20  mg Oral Daily  . predniSONE  60 mg Oral Daily  . sodium chloride  1,000 mL Intravenous Once  . sodium chloride  3 mL Intravenous Q12H  . Vilazodone HCl  40 mg Oral Daily   Continuous Infusions:  PRN Meds:.acetaminophen **OR** acetaminophen, albuterol, ALPRAZolam, benzonatate, promethazine Assessment/Plan:  CAP with sepsis -- Resolving. Has been Afebrile. WBC 20 today, persistently elevated. Pts home meds list  steroids, which patient says she has not taken in at least 1 year. -Continue ceftriaxone and doxycycline day 3 -Tessalon pearls and mucinex DM. -Step antigen negative, and legionella antigens pending -Sputum culture and gram stain- few WBC, no organisms seen. - Likely discharge tomorrow.  - Pt is requesting larger portable O2 tank, as the one she presently has runs out after 2 hrs. Talk to case manager tomorrow.   COPD exacerbation--GOLD Stage 3, severe COPD.  -Duonebs q4h. -Albuterol nebs q2h PRN. -Prednisone 60 mg daily, day 3, will reduce to  daily, confirm from pts  pharmacy is steroid prescription is active.  -Hold home Symbicort and Spiriva.  Nausea, vomiting, diarrhea--Resolved. Likely viral, will continue to monitor -Reglan 5 mg q6h PRN.  Hypertension--normotensive  -Restart home lisinopril, cont to hold nebivolol.   Anemia-- Hgb 11.9 in 08/2012 -Anemia panel pendng -FOBT ordered  Prolonged QT Possibly related to recent use of Phenergan. Also on fluoxetine chronically. - Stable so far, d/c tele.  Seasonal allergies -Continue home Flonase. -Claritin  daily  Anxiety -Continue home Vibryd. -Continue home Xanax 0.5 TID PRN.  DVT prophylaxis -Lovenox.  Dispo: Disposition is deferred at this time, awaiting improvement of current medical problems. Anticipated discharge in approximately 1 day(s).   The patient does have a current PCP ([place], PA-C), therefore will not be require OPC follow-up after discharge.   The patient does have transportation limitations that hinder transportation to clinic appointments.   .Services Needed at time of discharge: Y = Yes, Blank = No PT:   OT:   RN:   Equipment:   Other:     LOS: 2 days   Onnie Boer, MD 04/11/2014, 5:53 PM

## 2014-04-11 NOTE — Progress Notes (Signed)
DC tele per MD orders; CCMD notified.  Eldar Robitaille M, RN  

## 2014-04-11 NOTE — Clinical Documentation Improvement (Signed)
Please clarify if "respiratory distress," in setting of COPD and oxygen dependence can be further specified as one of the diagnoses listed below and document in pn or d/c summary.   Possible Clinical Conditions?  Acute Respiratory Failure Acute on Chronic Respiratory Failure Chronic Respiratory Failure Acute Respiratory Insufficiency Acute Respiratory Insufficiency following surgery or trauma Other Condition Cannot Clinically Determine   Supporting Information: Risk Factors: Seosis, PNA, COPD,   Signs & Symptoms: She is in respiratory distress per [facility] Oxygen dependent per pn 04/10/2013 presented with worsening SOB for 2 days along with a Productive cough.per [facility]  Diagnostics:  Treatment:  O2 via [place] @ 3L/per min  albuterol (PROVENTIL) (2.5 MG/3ML) 0.083% nebulizer solution 5 mg  benzonatate (TESSALON) capsule 100 mg    ipratropium-albuterol (DUONEB) 0.5-2.5 (3) MG/3ML nebulizer solution 3 mL  methylPREDNISolone sodium succinate (SOLU-MEDROL) 125 mg/2 mL injection 80 mg    fluticasone (FLONASE) 50 MCG/ACT nasal spray 2 spray   ipratropium-albuterol (DUONEB) 0.5-2.5 (3) MG/3ML nebulizer solution 3 mL  Thank You, Enis Slipper ,RN Clinical Documentation Specialist:  [phone]  [facility]- Health Information Management

## 2014-04-12 LAB — RESPIRATORY VIRUS PANEL
Adenovirus: NOT DETECTED
Influenza A H1: NOT DETECTED
Influenza A H3: NOT DETECTED
Influenza A: NOT DETECTED
Influenza B: NOT DETECTED
Metapneumovirus: NOT DETECTED
Parainfluenza 1: NOT DETECTED
Parainfluenza 2: NOT DETECTED
Parainfluenza 3: NOT DETECTED
Respiratory Syncytial Virus A: NOT DETECTED
Respiratory Syncytial Virus B: NOT DETECTED
Rhinovirus: DETECTED — AB

## 2014-04-12 LAB — CBC
HCT: 31.2 % — ABNORMAL LOW (ref 36.0–46.0)
HEMOGLOBIN: 9.6 g/dL — AB (ref 12.0–15.0)
MCH: 27.5 pg (ref 26.0–34.0)
MCHC: 30.8 g/dL (ref 30.0–36.0)
MCV: 89.4 fL (ref 78.0–100.0)
Platelets: 495 10*3/uL — ABNORMAL HIGH (ref 150–400)
RBC: 3.49 MIL/uL — ABNORMAL LOW (ref 3.87–5.11)
RDW: 12.2 % (ref 11.5–15.5)
WBC: 16.9 10*3/uL — AB (ref 4.0–10.5)

## 2014-04-12 LAB — PROCALCITONIN: Procalcitonin: 0.21 ng/mL

## 2014-04-12 MED ORDER — PREDNISONE 20 MG PO TABS: 40.0000 mg | ORAL_TABLET | Freq: Every day | ORAL | Status: DC

## 2014-04-12 MED ORDER — DOXYCYCLINE HYCLATE 100 MG PO TABS: 100.0000 mg | ORAL_TABLET | Freq: Two times a day (BID) | ORAL | Status: DC

## 2014-04-12 MED ORDER — BENZONATATE 100 MG PO CAPS: 100.0000 mg | ORAL_CAPSULE | Freq: Two times a day (BID) | ORAL | Status: DC | PRN

## 2014-04-12 MED ORDER — AMOXICILLIN-POT CLAVULANATE 875-125 MG PO TABS: 1.0000 | ORAL_TABLET | Freq: Two times a day (BID) | ORAL | Status: DC

## 2014-04-12 MED ORDER — AMOXICILLIN-POT CLAVULANATE 875-125 MG PO TABS
1.0000 | ORAL_TABLET | Freq: Once | ORAL | Status: AC
  Administered 2014-04-12: 1 via ORAL
  Filled 2014-04-12: qty 1

## 2014-04-12 NOTE — Progress Notes (Signed)
Subjective: Pt states breathing has improved. She is afraid to go home due to breathing. She lives at home w/ daughter.   Objective: Vital signs in last 24 hours: Filed Vitals:   04/11/14 1531 04/11/14 2112 04/12/14 0630 04/12/14 0745  BP: 139/78 161/56 175/76   Pulse: 92 93 86   Temp: 98.3 F (36.8 C) 97.6 F (36.4 C) 97.4 F (36.3 C)   TempSrc: Oral Oral Oral   Resp: Height:      Weight:      SpO2: 100% 98% 100% 100%   Weight change:   Intake/Output Summary (Last 24 hours) at 04/12/14 0802 Last data filed at 04/12/14 6045  Gross per 24 hour  Intake    440 ml  Output      0 ml  Net    440 ml   General: NAD, sitting up eating breakfast HEENT: moist mucous membranes Lungs: rt lower basilar wheezing Cardiac: RRR, no murmurs GI: active bowel sounds, soft, non tender to palpation Ext: no pedal edema Neuro: CN 2-12 grossly intact  Lab Results: Basic Metabolic Panel:  Recent Labs Lab 04/09/14 1551 04/09/14 1959 04/09/14 2034 04/10/14 0411  NA 135  --   --  135  K 3.7  --   --  3.7  CL 95*  --   --  99  CO2 26  --   --  26  GLUCOSE 115*  --   --  159*  BUN 20  --   --  20  CREATININE 0.81  --  0.79 0.75  CALCIUM 9.7  --   --  8.9  MG  --  1.7  --   --   PHOS  --  2.9  --   --    Liver Function Tests:  Recent Labs Lab 04/09/14 1551 04/10/14 0411  AST 62* 50*  ALT 55* 51*  ALKPHOS 77 58  BILITOT 0.4 0.3  PROT 7.2 6.1  ALBUMIN 3.1* 2.5*   CBC:  Recent Labs Lab 04/10/14 0411 04/11/14 0603 04/12/14 0542  WBC 16.4* 20.0* 16.9*  NEUTROABS 15.7* 17.9*  --   HGB 9.8* 9.1* 9.6*  HCT 30.2* 28.6* 31.2*  MCV 89.1 88.8 89.4  PLT 313 389 495*   Cardiac Enzymes:  Recent Labs Lab 04/09/14 2034 04/10/14 0411  TROPONINI 0.08* 0.03   Fasting Lipid Panel:  Recent Labs Lab 04/09/14 1959  CHOL 157  HDL 58  LDLCALC 87  TRIG 58  CHOLHDL 2.7   Thyroid Function Tests:  Recent Labs Lab 04/09/14 1959  TSH 0.748    Coagulation:  Recent Labs Lab 04/09/14 1959  LABPROT 13.8  INR 1.05   Anemia Panel:  Recent Labs Lab 04/09/14 1959  VITAMINB12 345  FOLATE 11.2  FERRITIN 243  TIBC Not calculated due to Iron <10.  IRON <10*  RETICCTPCT 1.1   Urinalysis:  Recent Labs Lab 04/10/14 0647  COLORURINE YELLOW  LABSPEC 1.018  PHURINE 5.5  GLUCOSEU NEGATIVE  HGBUR NEGATIVE  BILIRUBINUR NEGATIVE  KETONESUR 15*  PROTEINUR NEGATIVE  UROBILINOGEN 0.2  NITRITE NEGATIVE  LEUKOCYTESUR NEGATIVE   Medications: I have reviewed the patient's current medications. Scheduled Meds: . antiseptic oral rinse  7 mL Mouth Rinse BID  . cefTRIAXone (ROCEPHIN)  IV  1 g Intravenous Q24H  . dextromethorphan-guaiFENesin  1 tablet Oral BID  . doxycycline  100 mg Oral Q12H  . enoxaparin (LOVENOX) injection  40 mg Subcutaneous QHS  . feeding supplement (RESOURCE  BREEZE)  1 Container Oral TID BM  . ipratropium-albuterol  3 mL Nebulization TID  . lisinopril  10 mg Oral Daily  . loratadine  10 mg Oral Daily  . pantoprazole  20 mg Oral Daily  . predniSONE  40 mg Oral Daily  . sodium chloride  1,000 mL Intravenous Once  . sodium chloride  3 mL Intravenous Q12H  . Vilazodone HCl  40 mg Oral Daily   Continuous Infusions:  PRN Meds:.acetaminophen **OR** acetaminophen, albuterol, ALPRAZolam, benzonatate, promethazine Assessment/Plan:  CAP with sepsis -- Resolving. Has been Afebrile. WBC trending down to 16.2 today. -Continue ceftriaxone and doxycycline day 4, transition to augmentin BID and continue doxy for 3 more days.  -Tessalon pearls and mucinex DM. -Step antigen negative, and legionella antigens pending -Sputum culture and gram stain- few WBC, no organisms seen. - Pt is requesting larger portable O2 tank, as the one she presently has runs out after 2 hrs. Spoke w/ case management today who will help pt get O2 tank.   COPD exacerbation--GOLD Stage 3, severe COPD.  -Duonebs q4h. -Albuterol nebs q2h  PRN. - spoke w/ pt's pharmacy, last filled prednisone  w/ taper in June 2015. Will d/c pt home with 4 day course of prednisone .  -Hold home Symbicort and Spiriva.  Hypertension--normotensive  -Restart home lisinopril, cont to hold nebivolol.   Anemia-- Hgb 11.9 in 08/2012 -Anemia panel reveals anemia of chronic disease likely from severe COPD. Fe <10, Ferritin high normal. Folate WNL  Seasonal allergies -Continue home Flonase. -Claritin  daily  Anxiety -Continue home Vibryd. -Continue home Xanax 0.5 TID PRN.  DVT prophylaxis -Lovenox.  Dispo: d/c home today  The patient does have a current PCP ([place], PA-C), therefore will not be require OPC follow-up after discharge.   The patient does have transportation limitations that hinder transportation to clinic appointments.   .Services Needed at time of discharge: Y = Yes, Blank = No PT:   OT:   RN:   Equipment:   Other:     LOS: 3 days   Gara Kroner, MD 04/12/2014, 8:02 AM

## 2014-04-12 NOTE — Discharge Summary (Signed)
Name: Diamond Glass MRN: [id number] DOB: 02-Jan-1951 64 y.o. PCP: [name], PA-C  Date of Admission: 04/09/2014  3:23 PM Date of Discharge: 04/12/2014 Attending Physician: Inez Catalina, MD  Discharge Diagnosis: Principal Problem:   Community acquired pneumonia Active Problems:   Essential hypertension   COPD exacerbation   Chronic anemia   Anxiety and depression   Abnormal AST and ALT   History of tobacco abuse   Allergic rhinitis   Nausea & vomiting   Diarrhea   Oxygen dependent   Prolonged QT interval   Demand ischemia   Hypoalbuminemia  Discharge Medications:   Medication List    TAKE these medications        albuterol 108 (90 BASE) MCG/ACT inhaler  Commonly known as:  PROVENTIL HFA;VENTOLIN HFA  Inhale 1-2 puffs into the lungs every 6 (six) hours as needed for wheezing or shortness of breath.     albuterol (5 MG/ML) 0.5% nebulizer solution  Commonly known as:  PROVENTIL  Take 2.5 mg by nebulization 3 (three) times daily as needed. For shortness of breath.     ALPRAZolam 0.5 MG tablet  Commonly known as:  XANAX  Take 0.5 mg by mouth 3 (three) times daily as needed. For anxiety.     amoxicillin-clavulanate 875-125 MG per tablet  Commonly known as:  AUGMENTIN  Take 1 tablet by mouth 2 (two) times daily.     benzonatate 100 MG capsule  Commonly known as:  TESSALON  Take 1 capsule (100 mg total) by mouth 2 (two) times daily as needed for cough.     BREO ELLIPTA 100-25 MCG/INH Aepb  Generic drug:  Fluticasone Furoate-Vilanterol  Inhale 1 application into the lungs daily.     budesonide-formoterol 160-4.5 MCG/ACT inhaler  Commonly known as:  SYMBICORT  Inhale 2 puffs into the lungs 2 (two) times daily.     DAYQUIL PO  Take 2 tablets by mouth daily as needed. For flu and cold symptoms     doxycycline 100 MG tablet  Commonly known as:  VIBRA-TABS  Take 1 tablet (100 mg total) by mouth 2 (two) times daily.     fluticasone 50 MCG/ACT nasal spray    Commonly known as:  FLONASE  Place 2 sprays into the nose daily.     guaiFENesin 600 MG 12 hr tablet  Commonly known as:  MUCINEX  Take 1,200 mg by mouth 2 (two) times daily.     levalbuterol 45 MCG/ACT inhaler  Commonly known as:  XOPENEX HFA  Inhale 2 puffs into the lungs every 6 (six) hours as needed for wheezing.     lisinopril 10 MG tablet  Commonly known as:  PRINIVIL,ZESTRIL  Take 10 mg by mouth daily.     nebivolol 5 MG tablet  Commonly known as:  BYSTOLIC  Take 5 mg by mouth every evening.     NYQUIL PO  Take 2 tablets by mouth daily as needed. For flu and cold symptoms     predniSONE 20 MG tablet  Commonly known as:  DELTASONE  Take 2 tablets (40 mg total) by mouth daily.     tiotropium 18 MCG inhalation capsule  Commonly known as:  SPIRIVA  Place 18 mcg into inhaler and inhale daily.     VIIBRYD 40 MG Tabs  Generic drug:  Vilazodone HCl  Take by mouth daily.     ZYRTEC ALLERGY 10 MG Caps  Generic drug:  Cetirizine HCl  Take by mouth.  Disposition and follow-up:   Diamond Glass was discharged from [facility] in Stable condition.  At the hospital follow up visit please address:  1.  Please ensure pt completed course of abx- doxy and augmentin (end date 04/15/14)  2.  Labs / imaging needed at time of follow-up: none  3.  Pending labs/ test needing follow-up: legionella antigen, blood cultures, and sputum cultures  Follow-up Appointments:     Follow-up Information    Follow up with HOUT, Lowanda Foster, PA-C On 04/15/2014.   Specialty:  Physician Assistant   Why:  at 2:00pm for hospital follow up   Contact information:   [facility] [address] [place] [address] [phone]       Procedures Performed:  Dg Chest 2 View  04/09/2014   CLINICAL DATA:  Short of breath.  Weakness.  EXAM: CHEST  2 VIEW  COMPARISON:  08/15/2012.  FINDINGS: Emphysema. Cardiopericardial silhouette is probably unchanged allowing for  differences in projection. There is airspace density in the lateral RIGHT base abutting the pleural surface. This is not well seen on the lateral view and may be within the RIGHT lower lobe or RIGHT middle lobe. Configuration of the pulmonary hila unchanged compared to 2009. Basilar atelectasis is present on the LEFT. Aortic arch atherosclerosis.  IMPRESSION: Airspace disease in the lateral RIGHT base on the frontal view, most compatible with pneumonia. Followup in 4-6 weeks to ensure radiographic clearing and exclude an underlying lesion is recommended. Underlying emphysema.   Electronically Signed   By: Andreas Newport M.D.   On: 04/09/2014 16:23    Admission HPI: Diamond Glass is a 64 year old woman with history of COPD and HTN presenting with shortness of breath. She reports feeling "on top of the world" until 2 days ago when she woke up short of breath with a productive cough. She has been coughing up green mucus without blood. She denies chest pain, but says she was wheezing and felt like her chest was tight. Over the last two days she has also developed nausea, non-bloody, non-bilious vomiting, and diarrhea. Her PCP called in Phenergan, which she has been taking for the past couple of days with some relief of her symptoms. She says the her grandson is also sick with bronchitis. She has been trying her home Xopenex inhaler without relief. She also reports feeling anxious and being under a lot of stress. She ran out of her home Xanax and was unable to take it last night. She reports feeling warm, diaphoresis, and chills. She denies congestion or sore throat. She denies lower extremity edema, orthopnea, or periods of decreased mobility recently. She uses 4L O2 at baseline.  Hospital Course by problem list: Principal Problem:   Community acquired pneumonia Active Problems:   Essential hypertension   COPD exacerbation   Chronic anemia   Anxiety and depression   Abnormal AST and ALT    History of tobacco abuse   Allergic rhinitis   Nausea & vomiting   Diarrhea   Oxygen dependent   Prolonged QT interval   Demand ischemia   Hypoalbuminemia   CAP with sepsis--Opacities at R lung base consistent with pneumonia. WBC elevated at 20.8, tachypnea, and tachycardic so 3/4 SIRS on admission.Lactic acid WNL. Possible contribution of COPD to shortness of breath as well. Started on ceftriaxone and doxycycline in ER due to prolonged QT. Step pneumo and respiratory virus panel negative. Continued ceftriaxone for 3 days then transitioned to augmentin on  day of d/c and continued doxycycline both for a total of 7 days of tx. Breathing improved on day of d/c, pt still with anxiety from SOB. Pt has close follow up with her PCP in 3 days.   COPD exacerbation--FEV1 38% in 2010, GOLD Stage 3, severe COPD. On home oxygen. Exacerbation likely contributing to shortness of breath. Received Solumedrol 80 mg in the ER. D/c'd home w/ prednisone 40 mg x 4 days.   Discharge Vitals:   BP 175/76 mmHg  Pulse 86  Temp(Src) 97.4 F (36.3 C) (Oral)  Resp 20  Ht 5\' 4"  (1.626 m)  Wt 132 lb 4.8 oz (60.011 kg)  BMI 22.70 kg/m2  SpO2 100%  Discharge Labs:  Results for orders placed or performed during the hospital encounter of 04/09/14 (from the past 24 hour(s))  CBC     Status: Abnormal   Collection Time: 04/12/14  5:42 AM  Result Value Ref Range   WBC 16.9 (H) 4.0 - 10.5 K/uL   RBC 3.49 (L) 3.87 - 5.11 MIL/uL   Hemoglobin 9.6 (L) 12.0 - 15.0 g/dL   HCT 78.4 (L) 69.6 - 29.5 %   MCV 89.4 78.0 - 100.0 fL   MCH 27.5 26.0 - 34.0 pg   MCHC 30.8 30.0 - 36.0 g/dL   RDW 28.4 13.2 - 44.0 %   Platelets 495 (H) 150 - 400 K/uL  Procalcitonin     Status: None   Collection Time: 04/12/14  5:42 AM  Result Value Ref Range   Procalcitonin 0.21 ng/mL    Signed: Gara Kroner, MD 04/12/2014, 10:56 AM    Services Ordered on Discharge: none Equipment Ordered on Discharge: none

## 2014-04-12 NOTE — Progress Notes (Signed)
Discharge instructions reviewed with the patient and her daughter.  New and current medications reviewed as well as follow up appointments.  Both voice understanding to teaching.  To door via wheelchair.  Home via POV with her daughter driving.

## 2014-04-12 NOTE — Care Management Note (Signed)
  Page 1 of 1   04/12/2014     10:48:07 AM CARE MANAGEMENT NOTE 04/12/2014  Patient:  Diamond Glass, Diamond Glass   Account Number:  [id number]  Date Initiated:  04/12/2014  Documentation initiated by:  Ronny Flurry  Subjective/Objective Assessment:     Action/Plan:   Anticipated DC Date:  04/12/2014   Anticipated DC Plan:  HOME/SELF CARE         Choice offered to / List presented to:             Status of service:   Medicare Important Message given?  YES (If response is "NO", the following Medicare IM given date fields will be blank) Date Medicare IM given:  04/12/2014 Medicare IM given by:  Ronny Flurry Date Additional Medicare IM given:   Additional Medicare IM given by:    Discharge Disposition:    Per UR Regulation:    If discussed at Long Length of Stay Meetings, dates discussed:    Comments:  04-12-14 Dr Danella Penton called stated patient needed portable oxygen tank for transport home. Spoke with patient . Patient states she has portable oxygen tank to use while being transported home, however, she would like more portable oxygen tanks at home so she leave home for longer peroids of time.  Spoke with Cala Bradford at [employer]	[place] , Cala Bradford states [facility] has the smaller portable oxygen tanks and they can change for bigger portable tanks . Cala Bradford will call patient directly to arrange this. [facility] aware.  Ronny Flurry RN SBN [phone]

## 2014-04-12 NOTE — Discharge Instructions (Signed)
Take augmentin once tonight and then twice a day for 3 more days.   Take doxycycline once tonight and then twice a day for 3 more days.   Take prednisone  once a day for 4 more days.   You can take tessalon pearls for your cough.

## 2014-04-13 LAB — LEGIONELLA ANTIGEN, URINE

## 2014-04-13 LAB — CULTURE, RESPIRATORY W GRAM STAIN

## 2014-04-13 LAB — CULTURE, RESPIRATORY: CULTURE: NORMAL

## 2014-04-15 DIAGNOSIS — Z09 Encounter for follow-up examination after completed treatment for conditions other than malignant neoplasm: Secondary | ICD-10-CM | POA: Diagnosis not present

## 2014-04-15 DIAGNOSIS — F419 Anxiety disorder, unspecified: Secondary | ICD-10-CM | POA: Diagnosis not present

## 2014-04-15 DIAGNOSIS — Z9981 Dependence on supplemental oxygen: Secondary | ICD-10-CM | POA: Diagnosis not present

## 2014-04-15 DIAGNOSIS — J449 Chronic obstructive pulmonary disease, unspecified: Secondary | ICD-10-CM | POA: Diagnosis not present

## 2014-04-15 DIAGNOSIS — J189 Pneumonia, unspecified organism: Secondary | ICD-10-CM | POA: Diagnosis not present

## 2014-04-16 LAB — CULTURE, BLOOD (ROUTINE X 2)
Culture: NO GROWTH
Culture: NO GROWTH

## 2014-04-19 DIAGNOSIS — Z09 Encounter for follow-up examination after completed treatment for conditions other than malignant neoplasm: Secondary | ICD-10-CM | POA: Diagnosis not present

## 2014-04-19 DIAGNOSIS — J189 Pneumonia, unspecified organism: Secondary | ICD-10-CM | POA: Diagnosis not present

## 2014-04-22 DIAGNOSIS — R4182 Altered mental status, unspecified: Secondary | ICD-10-CM | POA: Diagnosis not present

## 2014-04-22 DIAGNOSIS — Z7189 Other specified counseling: Secondary | ICD-10-CM | POA: Diagnosis not present

## 2014-04-22 DIAGNOSIS — J449 Chronic obstructive pulmonary disease, unspecified: Secondary | ICD-10-CM | POA: Diagnosis not present

## 2014-04-22 DIAGNOSIS — Z7409 Other reduced mobility: Secondary | ICD-10-CM | POA: Diagnosis not present

## 2014-09-09 DIAGNOSIS — J449 Chronic obstructive pulmonary disease, unspecified: Secondary | ICD-10-CM | POA: Diagnosis not present

## 2014-09-09 DIAGNOSIS — F419 Anxiety disorder, unspecified: Secondary | ICD-10-CM | POA: Diagnosis not present

## 2014-09-09 DIAGNOSIS — Z6823 Body mass index (BMI) 23.0-23.9, adult: Secondary | ICD-10-CM | POA: Diagnosis not present

## 2014-11-29 DIAGNOSIS — R0602 Shortness of breath: Secondary | ICD-10-CM | POA: Diagnosis not present

## 2014-11-30 ENCOUNTER — Observation Stay (HOSPITAL_COMMUNITY): Payer: Medicare Other

## 2014-11-30 ENCOUNTER — Encounter (HOSPITAL_COMMUNITY): Payer: Self-pay

## 2014-11-30 ENCOUNTER — Inpatient Hospital Stay (HOSPITAL_COMMUNITY)
Admission: EM | Admit: 2014-11-30 | Discharge: 2014-12-01 | DRG: 192 | Disposition: A | Discharge status: Home / Self Care | Payer: Medicare Other | Attending: Student in an Organized Health Care Education/Training Program | Admitting: Student in an Organized Health Care Education/Training Program

## 2014-11-30 ENCOUNTER — Emergency Department (HOSPITAL_COMMUNITY): Payer: Medicare Other

## 2014-11-30 DIAGNOSIS — I10 Essential (primary) hypertension: Secondary | ICD-10-CM | POA: Diagnosis present

## 2014-11-30 DIAGNOSIS — D72829 Elevated white blood cell count, unspecified: Secondary | ICD-10-CM | POA: Diagnosis not present

## 2014-11-30 DIAGNOSIS — Z885 Allergy status to narcotic agent status: Secondary | ICD-10-CM | POA: Diagnosis not present

## 2014-11-30 DIAGNOSIS — Z9981 Dependence on supplemental oxygen: Secondary | ICD-10-CM

## 2014-11-30 DIAGNOSIS — J441 Chronic obstructive pulmonary disease with (acute) exacerbation: Secondary | ICD-10-CM | POA: Diagnosis not present

## 2014-11-30 DIAGNOSIS — J449 Chronic obstructive pulmonary disease, unspecified: Secondary | ICD-10-CM | POA: Diagnosis not present

## 2014-11-30 DIAGNOSIS — R0602 Shortness of breath: Secondary | ICD-10-CM

## 2014-11-30 DIAGNOSIS — F419 Anxiety disorder, unspecified: Secondary | ICD-10-CM | POA: Diagnosis not present

## 2014-11-30 DIAGNOSIS — Z79899 Other long term (current) drug therapy: Secondary | ICD-10-CM

## 2014-11-30 DIAGNOSIS — Z87891 Personal history of nicotine dependence: Secondary | ICD-10-CM

## 2014-11-30 LAB — BASIC METABOLIC PANEL
ANION GAP: 7 (ref 5–15)
Anion gap: 7 (ref 5–15)
BUN: 19 mg/dL (ref 6–20)
BUN: 19 mg/dL (ref 6–20)
CHLORIDE: 102 mmol/L (ref 101–111)
CO2: 31 mmol/L (ref 22–32)
CO2: 29 mmol/L (ref 22–32)
CREATININE: 0.8 mg/dL (ref 0.44–1.00)
Calcium: 8.9 mg/dL (ref 8.9–10.3)
Calcium: 9 mg/dL (ref 8.9–10.3)
Chloride: 101 mmol/L (ref 101–111)
Creatinine, Ser: 0.83 mg/dL (ref 0.44–1.00)
GFR calc Af Amer: >60 mL/min (ref >60)
GLUCOSE: 149 mg/dL — AB (ref 65–99)
Glucose, Bld: 168 mg/dL — ABNORMAL HIGH (ref 65–99)
POTASSIUM: 4.7 mmol/L (ref 3.5–5.1)
Potassium: 4.3 mmol/L (ref 3.5–5.1)
SODIUM: 140 mmol/L (ref 135–145)
SODIUM: 137 mmol/L (ref 135–145)

## 2014-11-30 LAB — CBC
HEMATOCRIT: 36.1 % (ref 36.0–46.0)
HEMOGLOBIN: 11.4 g/dL — AB (ref 12.0–15.0)
MCH: 29.3 pg (ref 26.0–34.0)
MCHC: 31.6 g/dL (ref 30.0–36.0)
MCV: 92.8 fL (ref 78.0–100.0)
Platelets: 354 10*3/uL (ref 150–400)
RBC: 3.89 MIL/uL (ref 3.87–5.11)
RDW: 12.5 % (ref 11.5–15.5)
WBC: 19.3 10*3/uL — ABNORMAL HIGH (ref 4.0–10.5)

## 2014-11-30 LAB — CBC WITH DIFFERENTIAL/PLATELET
BASOS ABS: 0 10*3/uL (ref 0.0–0.1)
Basophils Relative: 0 % (ref 0–1)
EOS ABS: 0 10*3/uL (ref 0.0–0.7)
EOS PCT: 0 % (ref 0–5)
HCT: 33.2 % — ABNORMAL LOW (ref 36.0–46.0)
Hemoglobin: 10.5 g/dL — ABNORMAL LOW (ref 12.0–15.0)
LYMPHS ABS: 0.2 10*3/uL — AB (ref 0.7–4.0)
LYMPHS PCT: 2 % — AB (ref 12–46)
MCH: 29.1 pg (ref 26.0–34.0)
MCHC: 31.6 g/dL (ref 30.0–36.0)
MCV: 92 fL (ref 78.0–100.0)
MONO ABS: 0 10*3/uL — AB (ref 0.1–1.0)
Monocytes Relative: 0 % — ABNORMAL LOW (ref 3–12)
Neutro Abs: 12 10*3/uL — ABNORMAL HIGH (ref 1.7–7.7)
Neutrophils Relative %: 98 % — ABNORMAL HIGH (ref 43–77)
PLATELETS: 310 10*3/uL (ref 150–400)
RBC: 3.61 MIL/uL — ABNORMAL LOW (ref 3.87–5.11)
RDW: 12.4 % (ref 11.5–15.5)
WBC: 12.3 10*3/uL — AB (ref 4.0–10.5)

## 2014-11-30 LAB — PROCALCITONIN

## 2014-11-30 MED ORDER — NEBIVOLOL HCL 5 MG PO TABS
5.0000 mg | ORAL_TABLET | Freq: Every evening | ORAL | Status: DC
  Administered 2014-11-30: 5 mg via ORAL
  Filled 2014-11-30 (×2): qty 1

## 2014-11-30 MED ORDER — ALPRAZOLAM 0.5 MG PO TABS
0.5000 mg | ORAL_TABLET | Freq: Three times a day (TID) | ORAL | Status: DC | PRN
  Administered 2014-11-30: 0.5 mg via ORAL
  Filled 2014-11-30: qty 1

## 2014-11-30 MED ORDER — ALBUTEROL (5 MG/ML) CONTINUOUS INHALATION SOLN
10.0000 mg/h | INHALATION_SOLUTION | Freq: Once | RESPIRATORY_TRACT | Status: DC
  Filled 2014-11-30: qty 20

## 2014-11-30 MED ORDER — IPRATROPIUM BROMIDE 0.02 % IN SOLN
0.5000 mg | Freq: Once | RESPIRATORY_TRACT | Status: AC
  Administered 2014-11-30: 0.5 mg via RESPIRATORY_TRACT
  Filled 2014-11-30: qty 2.5

## 2014-11-30 MED ORDER — FLUOXETINE HCL 20 MG PO CAPS
80.0000 mg | ORAL_CAPSULE | Freq: Every day | ORAL | Status: DC
  Administered 2014-11-30 – 2014-12-01 (×2): 80 mg via ORAL
  Filled 2014-11-30 (×3): qty 4

## 2014-11-30 MED ORDER — ENOXAPARIN SODIUM 40 MG/0.4ML ~~LOC~~ SOLN
40.0000 mg | SUBCUTANEOUS | Status: DC
  Administered 2014-12-01: 40 mg via SUBCUTANEOUS
  Filled 2014-11-30: qty 0.4

## 2014-11-30 MED ORDER — ACETAMINOPHEN 325 MG PO TABS
650.0000 mg | ORAL_TABLET | Freq: Four times a day (QID) | ORAL | Status: DC | PRN
  Administered 2014-11-30 – 2014-12-01 (×3): 650 mg via ORAL
  Filled 2014-11-30 (×4): qty 2

## 2014-11-30 MED ORDER — IPRATROPIUM-ALBUTEROL 0.5-2.5 (3) MG/3ML IN SOLN
3.0000 mL | RESPIRATORY_TRACT | Status: DC
  Administered 2014-11-30 – 2014-12-01 (×7): 3 mL via RESPIRATORY_TRACT
  Filled 2014-11-30 (×8): qty 3

## 2014-11-30 MED ORDER — IPRATROPIUM-ALBUTEROL 0.5-2.5 (3) MG/3ML IN SOLN: 3.0000 mL | RESPIRATORY_TRACT | Status: DC | PRN

## 2014-11-30 MED ORDER — PREDNISONE 20 MG PO TABS
60.0000 mg | ORAL_TABLET | Freq: Once | ORAL | Status: AC
  Administered 2014-11-30: 60 mg via ORAL
  Filled 2014-11-30: qty 3

## 2014-11-30 MED ORDER — ENOXAPARIN SODIUM 30 MG/0.3ML ~~LOC~~ SOLN
30.0000 mg | SUBCUTANEOUS | Status: DC
  Administered 2014-11-30: 30 mg via SUBCUTANEOUS
  Filled 2014-11-30: qty 0.3

## 2014-11-30 MED ORDER — ALBUTEROL SULFATE (2.5 MG/3ML) 0.083% IN NEBU
5.0000 mg | INHALATION_SOLUTION | Freq: Once | RESPIRATORY_TRACT | Status: AC
Start: 2014-11-30 — End: 2014-11-30
  Administered 2014-11-30: 5 mg via RESPIRATORY_TRACT
  Filled 2014-11-30: qty 6

## 2014-11-30 MED ORDER — PREDNISONE 20 MG PO TABS
40.0000 mg | ORAL_TABLET | Freq: Every day | ORAL | Status: DC
  Administered 2014-11-30 – 2014-12-01 (×2): 40 mg via ORAL
  Filled 2014-11-30 (×2): qty 2

## 2014-11-30 MED ORDER — LISINOPRIL 10 MG PO TABS
10.0000 mg | ORAL_TABLET | Freq: Every day | ORAL | Status: DC
  Administered 2014-11-30 – 2014-12-01 (×2): 10 mg via ORAL
  Filled 2014-11-30 (×2): qty 1

## 2014-11-30 MED ORDER — BUDESONIDE-FORMOTEROL FUMARATE 160-4.5 MCG/ACT IN AERO
2.0000 | INHALATION_SPRAY | Freq: Two times a day (BID) | RESPIRATORY_TRACT | Status: DC
  Administered 2014-11-30 – 2014-12-01 (×2): 2 via RESPIRATORY_TRACT
  Filled 2014-11-30: qty 6

## 2014-11-30 NOTE — ED Notes (Signed)
Pt arrived via EMS c/o respiratory distress starting around 1pm.  Pt wears 4L at home.  EMS arrived and pt was in tripod position, drooling, and diaphoretic. EMS gave neb tx x3,  solumedrol at 2339, 2g mag sulfate at 2356.

## 2014-11-30 NOTE — ED Provider Notes (Signed)
CSN: [id number]   Arrival date & time 11/30/14 0021  History  This chart was scribed for Diamond Rhine, MD by Bethel Born, ED Scribe. This patient was seen in room A05C/A05C and the patient's care was started at 12:56 AM.  Chief Complaint  Patient presents with  . Respiratory Distress    HPI Patient is a 64 y.o. female presenting with shortness of breath. The history is provided by the patient. No language interpreter was used.  Shortness of Breath Severity:  Severe Onset quality:  Gradual Duration:  12 hours Timing:  Constant Progression:  Worsening Chronicity:  New Relieved by:  Nothing Worsened by:  Nothing tried Associated symptoms: cough   Associated symptoms: no abdominal pain, no chest pain, no fever, no headaches, no hemoptysis, no sputum production and no vomiting    Brought in by EMS, Diamond Glass is a 64 y.o. female with PMHx of COPD and HTN who presents to the Emergency Department complaining of severe and constant SOB with gradual onset yesterday afternoon. This feels like prior COPD exacerbations. No palliating or worsening factors noted. She is on 4 L of oxygen at home.  Associated symptoms include non-productive cough and back pain. Pt denies chest pain, hemoptysis, fever, LE swelling, and syncope. Quit smoking 7 years ago  Past Medical History  Diagnosis Date  . COPD (chronic obstructive pulmonary disease)   . Hypertension     Past Surgical History  Procedure Laterality Date  . Abdominal hysterectomy      History reviewed. No pertinent family history.  Social History  Substance Use Topics  . Smoking status: Former Smoker -- 4.00 packs/day for 50 years    Types: Cigarettes    Quit date: 04/09/2008  . Smokeless tobacco: None  . Alcohol Use: No     Review of Systems  Constitutional: Negative for fever.  Respiratory: Positive for cough and shortness of breath. Negative for hemoptysis and sputum production.   Cardiovascular: Negative for chest pain  and leg swelling.  Gastrointestinal: Negative for nausea, vomiting and abdominal pain.  Musculoskeletal: Positive for back pain.  Neurological: Negative for headaches.  All other systems reviewed and are negative.   Home Medications   Prior to Admission medications   Medication Sig Start Date End Date Taking? Authorizing Provider  albuterol (PROVENTIL HFA;VENTOLIN HFA) 108 (90 BASE) MCG/ACT inhaler Inhale 1-2 puffs into the lungs every 6 (six) hours as needed for wheezing or shortness of breath.    Historical Provider, MD  albuterol (PROVENTIL) (5 MG/ML) 0.5% nebulizer solution Take 2.5 mg by nebulization 3 (three) times daily as needed. For shortness of breath.     Historical Provider, MD  ALPRAZolam [name]) 0.5 MG tablet Take 0.5 mg by mouth 3 (three) times daily as needed. For anxiety.     Historical Provider, MD  amoxicillin-clavulanate (AUGMENTIN) 875-125 MG per tablet Take 1 tablet by mouth 2 (two) times daily. 04/12/14   Denton Brick, MD  benzonatate (TESSALON) 100 MG capsule Take 1 capsule (100 mg total) by mouth 2 (two) times daily as needed for cough. 04/12/14   Denton Brick, MD  budesonide-formoterol [facility]) 160-4.5 MCG/ACT inhaler Inhale 2 puffs into the lungs 2 (two) times daily.      Historical Provider, MD  Cetirizine HCl (ZYRTEC ALLERGY) 10 MG CAPS Take by mouth.    Historical Provider, MD  doxycycline (VIBRA-TABS) 100 MG tablet Take 1 tablet (100 mg total) by mouth 2 (two) times daily. 04/12/14   Denton Brick, MD  fluticasone (FLONASE) 50 MCG/ACT nasal spray Place 2 sprays into the nose daily. Patient not taking: Reported on 04/09/2014 08/15/12   Jerelyn Scott, MD  Fluticasone Furoate-Vilanterol (BREO ELLIPTA) 100-25 MCG/INH AEPB Inhale 1 application into the lungs daily.    Historical Provider, MD  guaiFENesin (MUCINEX) 600 MG 12 hr tablet Take 1,200 mg by mouth 2 (two) times daily.      Historical Provider, MD  levalbuterol [facility] HFA) 45 MCG/ACT inhaler Inhale 2 puffs  into the lungs every 6 (six) hours as needed for wheezing.    Historical Provider, MD  lisinopril (PRINIVIL,ZESTRIL) 10 MG tablet Take 10 mg by mouth daily.    Historical Provider, MD  nebivolol (BYSTOLIC) 5 MG tablet Take 5 mg by mouth every evening.      Historical Provider, MD  predniSONE (DELTASONE) 20 MG tablet Take 2 tablets (40 mg total) by mouth daily. 04/12/14   Denton Brick, MD  Pseudoeph-Doxylamine-DM-APAP (NYQUIL PO) Take 2 tablets by mouth daily as needed. For flu and cold symptoms    Historical Provider, MD  Pseudoephedrine-APAP-DM (DAYQUIL PO) Take 2 tablets by mouth daily as needed. For flu and cold symptoms    Historical Provider, MD  tiotropium (SPIRIVA) 18 MCG inhalation capsule Place 18 mcg into inhaler and inhale daily.      Historical Provider, MD  Vilazodone HCl (VIIBRYD) 40 MG TABS Take by mouth daily.    Historical Provider, MD    Allergies  Codeine and Morphine and related  Triage Vitals: BP 164/84 mmHg  Pulse 108  Temp(Src) 97.6 F (36.4 C) (Axillary)  Resp 21  SpO2 100%  Physical Exam CONSTITUTIONAL: Well developed/well nourished HEAD: Normocephalic/atraumatic EYES: EOMI/PERRL ENMT: Mucous membranes moist NECK: supple no meningeal signs SPINE/BACK:entire spine nontender CV: S1/S2 noted, no murmurs/rubs/gallops noted LUNGS:tachypnea with decreased breath sounds bilaterally ABDOMEN: soft, nontender, no rebound or guarding, bowel sounds noted throughout abdomen GU:no cva tenderness NEURO: Pt is awake/alert/appropriate, moves all extremitiesx4.  No facial droop.   EXTREMITIES: pulses normal/equal, full ROM, no edema SKIN: warm, color normal PSYCH: no abnormalities of mood noted, alert and oriented to situation  ED Course  Procedures CRITICAL CARE Performed by: Joya Gaskins Total critical care time: 33 Critical care time was exclusive of separately billable procedures and treating other patients. Critical care was necessary to treat or prevent  imminent or life-threatening deterioration. Critical care was time spent personally by me on the following activities: development of treatment plan with patient and/or surrogate as well as nursing, discussions with consultants, evaluation of patient's response to treatment, examination of patient, obtaining history from patient or surrogate, ordering and performing treatments and interventions, ordering and review of laboratory studies, ordering and review of radiographic studies, pulse oximetry and re-evaluation of patient's condition. PATIENT REQUIRED MULTIPLE ALBUTEROL TREATMENTS AND STILL IN DISTRESS   DIAGNOSTIC STUDIES: Oxygen Saturation is 100% on 12L, adequate by my interpretation.    COORDINATION OF CARE: 1:00 AM Discussed treatment plan which includes CXR, lab work, and breathing treatments with pt at bedside and pt agreed to plan.  2:58 AM-Consult complete with an Internal Medicine Resident. Patient case explained and discussed. Agrees to admit patient for further evaluation and treatment. Call ended at 2:59 AM   Pt with continued wheeze She was hypoxic/distressed on ambulation Will need continuous albuterol  will admit  Labs Reviewed  BASIC METABOLIC PANEL - Abnormal; Notable for the following:    Glucose, Bld 168 (*)    All other components within normal limits  CBC -  Abnormal; Notable for the following:    WBC 19.3 (*)    Hemoglobin 11.4 (*)    All other components within normal limits    I, Diamond Rhine, MD, personally reviewed and evaluated these images and lab results as part of my medical decision-making.  Imaging Review Dg Chest Portable 1 View  11/30/2014   CLINICAL DATA:  Shortness of breath.  EXAM: PORTABLE CHEST - 1 VIEW  COMPARISON:  04/09/2014  FINDINGS: Normal heart size and pulmonary vascularity. Emphysematous changes in the lungs. Diffuse interstitial fibrosis. No focal consolidation. No blunting of costophrenic angles. No pneumothorax. Mediastinal  contours appear intact.  IMPRESSION: Emphysematous changes and interstitial fibrosis in the lungs. No focal consolidation.   Electronically Signed   By: Burman Nieves M.D.   On: 11/30/2014 01:43    EKG Interpretation  Date/Time:  Tuesday November 30 2014 01:08:36 EDT Ventricular Rate:  103 PR Interval:  124 QRS Duration: 97 QT Interval:  391 QTC Calculation: 512 R Axis:   80 Text Interpretation:  Sinus tachycardia Right atrial enlargement Prolonged QT interval artifact noted Abnormal ekg No significant change since last tracing Confirmed by Bebe Shaggy  MD, Dorinda Hill ([id number]) on 11/30/2014 1:15:49 AM       MDM   Final diagnoses:  Chronic obstructive pulmonary disease with acute exacerbation     Nursing notes including past medical history and social history reviewed and considered in documentation xrays/imaging reviewed by myself and considered during evaluation Labs/vital reviewed myself and considered during evaluation   I personally performed the services described in this documentation, which was scribed in my presence. The recorded information has been reviewed and is accurate.    Diamond Rhine, MD 11/30/14 [phone]

## 2014-11-30 NOTE — H&P (Signed)
Internal Medicine Attending Admission Note  I saw and evaluated the patient. I reviewed the resident's note and I agree with the resident's findings and plan as documented in the resident's note.  Assessment & Plan by Problem:  Active Problems:   COPD exacerbation   COPD exacerbation: Clinical course and exam today seems most consistent with a COPD exacerbation. Clinically I don't see any evidence of community-acquired pneumonia. Chest x-ray is also clear with only chronic emphysematous changes. We will treat her with systemic steroids with prednisone 40 mg, albuterol and ipratropium nebs at least 4 times daily, and continue home budesonide and formoterol. If she shows any signs of infection like sputum production or fever, we will initiate antibiotic with levofloxacin. Physical therapy to see the patient today. If she shows improvement, may be ready for discharge tomorrow, depending on a ambulatory oxygen saturation with home settings.   Chief Complaint(s): Shortness of breath  History - key components related to admission:  64 year old woman with goal stage III chronic COPD on 4 L of supplemental oxygen via nasal cannula at home, resented to the emergency department with progressive shortness of breath. Patient was in her usual state of health until a few days ago, started to feel more short of breath with exertion and then eventually with rest. No recent sick contacts. Denies any fevers or chills at home. Sputum production is unchanged. Reports good compliance with her home inhalers. Denies any chest pain. Otherwise eating and drinking well.  Lab results: Reviewed in Epic  Physical Exam - key components related to admission:  Filed Vitals:   11/30/14 0245 11/30/14 0300 11/30/14 0315 11/30/14 0410  BP: 157/88 135/75 134/85 150/90  Pulse: 99 95 94 91  Temp:    97.9 F (36.6 C)  TempSrc:    Oral  Resp:    30  Height:     (1.6 m)  Weight:    128 lb 8.5 oz (58.3 kg)  SpO2: 99% 100%  100% 100%    Gen: Chronically ill-appearing woman, no distress ENT: MMM CV: Mildly tachycardic, no murmurs Resp: Mildly tachypneic, decreased air movement in the left bases, no wheezing, sounds tight throughout. Abd: Soft, nontender, nondistended Ext: Warm and well-perfused, no edema

## 2014-11-30 NOTE — Progress Notes (Signed)
   Subjective: Patient has some improvement in breathing overnight with treatments. She is on 4L O2 nasal cannula. Will try to get up and walk as tolerated. Objective: Vital signs in last 24 hours: Filed Vitals:   11/30/14 0245 11/30/14 0300 11/30/14 0315 11/30/14 0410  BP: 157/88 135/75 134/85 150/90  Pulse: 99 95 94 91  Temp:    97.9 F (36.6 C)  TempSrc:    Oral  Resp:    30  Height:     (1.6 m)  Weight:    128 lb 8.5 oz (58.3 kg)  SpO2: 99% 100% 100% 100%   Weight change:   Intake/Output Summary (Last 24 hours) at 11/30/14 1145 Last data filed at 11/30/14 1010  Gross per 24 hour  Intake    240 ml  Output      0 ml  Net    240 ml   General: resting in bed Cardiac: RRR, no rubs, murmurs or gallops Pulm: scant bibasilar wheezing Abd: soft, nontender, nondistended, BS present Ext: warm and well perfused, no pedal edema Neuro: alert and oriented X3  Assessment/Plan:  COPD Exacerbation: Patient's symptoms sound consistent with acute COPD exacerbation. She states that she takes her home medications regularly as well as before she came to the hospital. Although she has a WBC of 19 she denies any productive sputum, fever, chills or other symptoms concerning for pneumonia. 2 view CXR shows COPD without acute abnormality. -Continue Prednisone 40 mg -Continue Albuterol-ipratropium nebulizer, budesonide-formoterol inhaler -CBC w/ diff in the am -Will need to consider antibiotics (levofloxacin) if symptoms do not improve   Dispo: Disposition is deferred at this time, awaiting improvement of current medical problems.  Anticipated discharge in approximately 1-2 day(s).   The patient does have a current PCP ([place], PA-C) and does not need an [facility] hospital follow-up appointment after discharge.  The patient does not have transportation limitations that hinder transportation to clinic appointments.  .Services Needed at time of discharge: Y = Yes, Blank = No PT:   OT:     RN:   Equipment:   Other:     LOS: 0 days   [name], MD 11/30/2014, 11:45 AM

## 2014-11-30 NOTE — H&P (Signed)
Date: 11/30/2014               Patient Name:  Diamond Glass MRN: [id number]  DOB: 1950/04/26 Age / Sex: 64 y.o., female   PCP: [name], PA-C         Medical Service: Internal Medicine Teaching Service         Attending Physician: Dr. Tyson Alias, MD    First Contact: Dr. [name] Pager: [phone]  Second Contact: Dr. Carlynn Purl Pager: [phone]       After Hours (After 5p/  First Contact Pager: [phone]  weekends / holidays): Second Contact Pager: [phone]   Chief Complaint: shortness of breath  History of Present Illness: [name] is a 64 y.o. Caucasian female with past medical history of COPD, hypertension, and anxiety who presents with worsening shortness of breath.  Patient has PFTs from November 2010 that show an FEV1 of 38%, classifying her as GOLD Stage III, severe COPD.  She reports that her symptoms of shortness of breath started around 12pm on Monday afternoon and progressively worsened throughout the course of day and night until she decided she needed to be seen in the emergency department.  States that she is compliant with her home inhalers and also requires the use of home O2 continuously at 4L.  She reports that her shortness of breath is aggravated by stress which causes her to become anxious and ultimately short of breath.  She attributes this most recent exacerbation to stress at home where she lives with her husband and her daughter.  She also states that she recently was switched by her PCP from TID dosing to once daily extended release dosing of her Xanax and believes this to not be as effective. She denies any recent cough or increase in sputum production, denies fever or chills, denies nausea or vomiting.  She states she quit smoking approximately 6-7 years ago but is unfortunately still exposed to second hand smoke by those around her in the household.   In the ED, patient received albuterol nebs x 2, Atrovent neb x 1, and prednisone   PO once.  She attempted to ambulate in the hallway where her O2 sats dropped to 82%.  She also had a portable 1-view chest xray that was read as having emphysematous changes and interstitial fibrosis, but no focal consolidation.  She reported an improvement of her symptoms with these internventions and stated that her breathing was only mildly labored.  On exam she was maintaining O2 sats of 98% on 4L via nasal cannula.   Meds: Current Facility-Administered Medications  Medication Dose Route Frequency Provider Last Rate Last Dose  . albuterol (PROVENTIL,VENTOLIN) solution continuous neb  10 mg/hr Nebulization Once Zadie Rhine, MD       Current Outpatient Prescriptions  Medication Sig Dispense Refill  . albuterol (PROVENTIL HFA;VENTOLIN HFA) 108 (90 BASE) MCG/ACT inhaler Inhale 1-2 puffs into the lungs every 6 (six) hours as needed for wheezing or shortness of breath.    Marland Kitchen albuterol (PROVENTIL) (5 MG/ML) 0.5% nebulizer solution Take 2.5 mg by nebulization 3 (three) times daily as needed. For shortness of breath.     . ALPRAZolam (XANAX) 0.5 MG tablet Take 0.5 mg by mouth 3 (three) times daily as needed. For anxiety.     . budesonide-formoterol (SYMBICORT) 160-4.5 MCG/ACT inhaler Inhale 2 puffs into the lungs 2 (two) times daily.      Marland Kitchen FLUoxetine (PROZAC) 40 MG capsule Take 80 mg by mouth  daily.    . levalbuterol (XOPENEX HFA) 45 MCG/ACT inhaler Inhale 2 puffs into the lungs every 6 (six) hours as needed for wheezing.    Marland Kitchen lisinopril (PRINIVIL,ZESTRIL) 10 MG tablet Take 10 mg by mouth daily.    . nebivolol (BYSTOLIC) 5 MG tablet Take 5 mg by mouth every evening.      . Pseudoeph-Doxylamine-DM-APAP (NYQUIL PO) Take 2 tablets by mouth daily as needed. For flu and cold symptoms    . Pseudoephedrine-APAP-DM (DAYQUIL PO) Take 2 tablets by mouth daily as needed. For flu and cold symptoms    . tiotropium (SPIRIVA) 18 MCG inhalation capsule Place 18 mcg into inhaler and inhale daily.         Allergies: Allergies as of 11/30/2014 - Review Complete 11/30/2014  Allergen Reaction Noted  . Codeine    . Morphine and related Other (See Comments) 03/02/2011   Past Medical History  Diagnosis Date  . COPD (chronic obstructive pulmonary disease)   . Hypertension    Past Surgical History  Procedure Laterality Date  . Abdominal hysterectomy     History reviewed. No pertinent family history. Social History   Social History  . Marital Status: Married    Spouse Name: N/A  . Number of Children: N/A  . Years of Education: N/A   Occupational History  . Not on file.   Social History Main Topics  . Smoking status: Former Smoker -- 4.00 packs/day for 50 years    Types: Cigarettes    Quit date: 04/09/2008  . Smokeless tobacco: Not on file  . Alcohol Use: No  . Drug Use: No  . Sexual Activity: Not on file   Other Topics Concern  . Not on file   Social History Narrative    Review of Systems: Review of Systems  Constitutional: Negative for fever, chills, malaise/fatigue and diaphoresis.  HENT: Negative for congestion and sore throat.   Respiratory: Positive for shortness of breath. Negative for cough, hemoptysis and sputum production.   Cardiovascular: Negative for chest pain, palpitations and leg swelling.  Gastrointestinal: Negative for nausea, vomiting, abdominal pain, diarrhea and constipation.  Genitourinary: Negative for dysuria and frequency.  Musculoskeletal: Negative for myalgias and back pain.  Neurological: Negative for dizziness, focal weakness and loss of consciousness.  Psychiatric/Behavioral: The patient is nervous/anxious.     Physical Exam: Blood pressure 134/85, pulse 94, temperature 97.6 F (36.4 C), temperature source Axillary, resp. rate 24, SpO2 100 %.  Physical Exam  Constitutional: She is oriented to person, place, and time. She appears well-developed and well-nourished.  HENT:  Head: Normocephalic and atraumatic.  Eyes: EOM are normal.   Neck: Normal range of motion. No JVD present.  Cardiovascular: Normal rate, regular rhythm, normal heart sounds and intact distal pulses.   Respiratory:  Decreased respiratory effort without any significant difficulty breathing.   Scattered rhonchi throughout but most appreciated in the right upper lobe  Neurological: She is alert and oriented to person, place, and time. No cranial nerve deficit.  Skin: Skin is warm and intact. She is not diaphoretic.  Multiple small scratches on her forearms that she attributes to her grandson's dog    Lab results: Basic Metabolic Panel:  Recent Labs  81/19/14 0102  NA 140  K 4.3  CL 102  CO2 31  GLUCOSE 168*  BUN 19  CREATININE 0.83  CALCIUM 8.9   CBC:  Recent Labs  11/30/14 0102  WBC 19.3*  HGB 11.4*  HCT 36.1  MCV 92.8  PLT  354    Imaging results:  Dg Chest Portable 1 View  11/30/2014   CLINICAL DATA:  Shortness of breath.  EXAM: PORTABLE CHEST - 1 VIEW  COMPARISON:  04/09/2014  FINDINGS: Normal heart size and pulmonary vascularity. Emphysematous changes in the lungs. Diffuse interstitial fibrosis. No focal consolidation. No blunting of costophrenic angles. No pneumothorax. Mediastinal contours appear intact.  IMPRESSION: Emphysematous changes and interstitial fibrosis in the lungs. No focal consolidation.   Electronically Signed   By: Burman Nieves M.D.   On: 11/30/2014 01:43    Other results: EKG: Sinus tachycardia with right atrial enlargement, prolonged QT.  No significant change since last EKG  Assessment & Plan by Problem: Active Problems:   COPD exacerbation  64 y.o. Caucasian female with past medical history of COPD, hypertension, and anxiety who presents with worsening shortness of breath.  COPD Exacerbation -FEV1 38% in 2010 (GOLD Stage III, severe COPD), requiring 4L of continuous home oxygen.  Received breathing treatments and steroids in the emergency department.  On exam, she was normotensive, not  tachycardic, maintaining O2 sats at 98% on 4L via nasal cannula.  She still reported some labored breathing with scattered rhonchi but no significant wheeze.  Her respiratory rate is 24 and she is afebrile.  Portable 1-view chest xray negative for a focal consolidation.  She does have a leukocytosis of 19.3 without differential but otherwise shows no signs of infection. -BMP, CBC w/ diff, procalcitonin in the morning -will obtain a 2-view chest xray to assess for any underlying consolidation -Symbicort inhaler BID -Duonebs q4h plus q2h prn -Prednisone 40mg  PO daily x 4 days -we will withhold starting antibiotic therapy for now pending the 2-view chest xray.  Reasoning is because patient presents with increased dyspnea but does not have other symptoms of any increased sputum volume or purulence.  She does have complicated COPD given her age >65 years and her FEV1 < 50% does present a risk factor for Pseudomonas if her clinical picture worsens.  Given her prolonged QTc on EKG, would caution use of fluoroquinalones if decision to proceed with antibiotics is made -do not see anything in chart review of patient having received any pneumonia vaccine in her past.  This is likely something to address with her during her hospitalization  Hypertension -stable -continue home medications: lisinopril 10mg  PO daily, nebivolol (Bystolic) 5mg  PO daily  Anxiety -seems to be contributory to patients COPD exacerbation -continue Prozac 80mg  PO daily -continue Xanax 0.5mg  PO TID prn for anxiety  Diet: regular diet  DVT PPX: Lovenox  Code: full code  Dispo: Disposition is deferred at this time, awaiting improvement of current medical problems. Anticipated discharge in approximately 2-3 day(s).   The patient does have a current PCP (Diamond Leone, PA-C) and does not need an [facility] hospital follow-up appointment after discharge.  The patient does not have transportation limitations that hinder transportation to  clinic appointments.  Signed: Gwynn Burly, DO 11/30/2014, 3:35 AM

## 2014-11-30 NOTE — ED Notes (Signed)
Pt ambulated in hallway O2 sats dropped to 82%, pt could not make it around nurses station.  Used wheelchair to return to room.

## 2014-11-30 NOTE — Evaluation (Signed)
Physical Therapy Evaluation Patient Details Name: Diamond Glass MRN: [id number] DOB: 12-29-1950 Today's Date: 11/30/2014   History of Present Illness  Patient is a 64 y/o female presents with hx of COPD, HTN and anxiety who presents with worsening shortness of breath. Pt with PFTs from November 2010 that show an FEV1 of 38%, classifying her as GOLD Stage III, severe COPD. CXR-emphysematous changes and interstitial fibrosis  Clinical Impression  Patient presents with dyspnea on exertion, decreased cardiovascular endurance and decreased activity tolerance secondary to SOB impacting mobility. Education re: performing short bouts of activity with longer rest breaks, pursed lip breathing, energy conservation techniques and relaxation techniques as pt reports anxiety/stress increase SOB. Pt will have support from daughter at home. Would benefit from outpatient pulmonary rehab to improve activity tolerance, mobility and endurance at d/c. Will follow acutely.     Follow Up Recommendations Outpatient PT;Supervision for mobility/OOB (pulmonary rehab)    Equipment Recommendations  None recommended by PT    Recommendations for Other Services       Precautions / Restrictions Precautions Precautions: Fall Restrictions Weight Bearing Restrictions: No      Mobility  Bed Mobility               General bed mobility comments: Sitting EOB upon PT arrival.   Transfers Overall transfer level: Needs assistance Equipment used: Rolling walker (2 wheeled) Transfers: Sit to/from Stand Sit to Stand: Supervision         General transfer comment: Supervision for safety.   Ambulation/Gait Ambulation/Gait assistance: Min guard Ambulation Distance (Feet): 100 Feet Assistive device: Rolling walker (2 wheeled) Gait Pattern/deviations: Step-through pattern;Decreased stride length;Trunk flexed   Gait velocity interpretation: <1.8 ft/sec, indicative of risk for recurrent falls General Gait  Details: Slow, guarded gait. Dyspnea present. Cues for pursed lip breathing. Pt on 6L 02 . Sa02 remained >93% during gait.   Stairs            Wheelchair Mobility    Modified Rankin (Stroke Patients Only)       Balance Overall balance assessment: Needs assistance Sitting-balance support: Feet supported;No upper extremity supported Sitting balance-Leahy Scale: Good Sitting balance - Comments: Able to reach outside BoS and donn socks without difficulty.    Standing balance support: During functional activity Standing balance-Leahy Scale: Fair                               Pertinent Vitals/Pain Pain Assessment: No/denies pain    Home Living Family/patient expects to be discharged to:: Private residence Living Arrangements: Children;Spouse/significant other Available Help at Discharge: Family;Available PRN/intermittently Type of Home: House Home Access: Stairs to enter Entrance Stairs-Rails: Right Entrance Stairs-Number of Steps: 2 Home Layout: One level Home Equipment: Walker - 2 wheels;Bedside commode;Cane - single point      Prior Function Level of Independence: Independent with assistive device(s)         Comments: Pt uses RW for ambulation. Reports multiple falls. Wears 4L 02 at home. Uses buggy at grocery store. Daughter assists with IADLs. "it takes me all day to get anything done."     Hand Dominance        Extremity/Trunk Assessment   Upper Extremity Assessment: Defer to OT evaluation           Lower Extremity Assessment: Defer to PT evaluation;Overall [facility] for tasks assessed         Communication   Communication: No difficulties  Cognition Arousal/Alertness:  Awake/alert Behavior During Therapy: Anxious Overall Cognitive Status: Within Functional Limits for tasks assessed                      General Comments      Exercises        Assessment/Plan    PT Assessment Patient needs continued PT services  PT  Diagnosis Difficulty walking   PT Problem List Cardiopulmonary status limiting activity;Decreased activity tolerance;Decreased balance;Decreased mobility  PT Treatment Interventions Balance training;Gait training;Stair training;Functional mobility training;Therapeutic activities;Therapeutic exercise;Patient/family education   PT Goals (Current goals can be found in the Care Plan section) Acute Rehab PT Goals Patient Stated Goal: to be able to breathe PT Goal Formulation: With patient Time For Goal Achievement: 12/14/14 Potential to Achieve Goals: Fair    Frequency Min 3X/week   Barriers to discharge        Co-evaluation               End of Session Equipment Utilized During Treatment: Oxygen;Gait belt Activity Tolerance: Patient tolerated treatment well Patient left: in chair;with call bell/phone within reach Nurse Communication: Mobility status    Functional Assessment Tool Used: Clinical judgment Functional Limitation: Mobility: Walking and moving around Mobility: Walking and Moving Around Current Status [phone]): At least 1 percent but less than 20 percent impaired, limited or restricted Mobility: Walking and Moving Around Goal Status [phone]): At least 1 percent but less than 20 percent impaired, limited or restricted    Time: 9147-8295 PT Time Calculation (min) (ACUTE ONLY): 23 min   Charges:   PT Evaluation $Initial PT Evaluation Tier I: 1 Procedure PT Treatments $Gait Training: 8-22 mins   PT G Codes:   PT G-Codes **NOT FOR INPATIENT CLASS** Functional Assessment Tool Used: Clinical judgment Functional Limitation: Mobility: Walking and moving around Mobility: Walking and Moving Around Current Status (A2130): At least 1 percent but less than 20 percent impaired, limited or restricted Mobility: Walking and Moving Around Goal Status [phone]): At least 1 percent but less than 20 percent impaired, limited or restricted    Karel Mowers A Syncere Eble 11/30/2014, 4:58  PM Mylo Red, PT, DPT [phone]

## 2014-12-01 DIAGNOSIS — Z885 Allergy status to narcotic agent status: Secondary | ICD-10-CM | POA: Diagnosis not present

## 2014-12-01 DIAGNOSIS — I10 Essential (primary) hypertension: Secondary | ICD-10-CM | POA: Diagnosis not present

## 2014-12-01 DIAGNOSIS — Z9981 Dependence on supplemental oxygen: Secondary | ICD-10-CM | POA: Diagnosis not present

## 2014-12-01 DIAGNOSIS — J441 Chronic obstructive pulmonary disease with (acute) exacerbation: Secondary | ICD-10-CM | POA: Diagnosis not present

## 2014-12-01 DIAGNOSIS — F419 Anxiety disorder, unspecified: Secondary | ICD-10-CM | POA: Diagnosis not present

## 2014-12-01 DIAGNOSIS — Z87891 Personal history of nicotine dependence: Secondary | ICD-10-CM | POA: Diagnosis not present

## 2014-12-01 LAB — CBC WITH DIFFERENTIAL/PLATELET
BASOS ABS: 0 10*3/uL (ref 0.0–0.1)
BASOS PCT: 0 % (ref 0–1)
EOS ABS: 0 10*3/uL (ref 0.0–0.7)
Eosinophils Relative: 0 % (ref 0–5)
HCT: 33.2 % — ABNORMAL LOW (ref 36.0–46.0)
HEMOGLOBIN: 10.3 g/dL — AB (ref 12.0–15.0)
LYMPHS ABS: 2.2 10*3/uL (ref 0.7–4.0)
Lymphocytes Relative: 11 % — ABNORMAL LOW (ref 12–46)
MCH: 28.5 pg (ref 26.0–34.0)
MCHC: 31 g/dL (ref 30.0–36.0)
MCV: 92 fL (ref 78.0–100.0)
Monocytes Absolute: 1.6 10*3/uL — ABNORMAL HIGH (ref 0.1–1.0)
Monocytes Relative: 8 % (ref 3–12)
NEUTROS PCT: 81 % — AB (ref 43–77)
Neutro Abs: 16.6 10*3/uL — ABNORMAL HIGH (ref 1.7–7.7)
PLATELETS: 322 10*3/uL (ref 150–400)
RBC: 3.61 MIL/uL — AB (ref 3.87–5.11)
RDW: 12.6 % (ref 11.5–15.5)
WBC: 20.4 10*3/uL — AB (ref 4.0–10.5)

## 2014-12-01 MED ORDER — PREDNISONE 20 MG PO TABS: 20.0000 mg | ORAL_TABLET | Freq: Every day | ORAL | Status: DC

## 2014-12-01 NOTE — Discharge Summary (Signed)
Name: KEYARIA LAWSON MRN: [id number] DOB: Jan 10, 1951 64 y.o. PCP: [name], PA-C  Date of Admission: 11/30/2014 12:21 AM Date of Discharge: 12/01/2014 Attending Physician: No att. providers found  Discharge Diagnosis: 1. COPD exacerbation  Active Problems:   COPD exacerbation  Discharge Medications:   Medication List    TAKE these medications        albuterol 108 (90 BASE) MCG/ACT inhaler  Commonly known as:  PROVENTIL HFA;VENTOLIN HFA  Inhale 1-2 puffs into the lungs every 6 (six) hours as needed for wheezing or shortness of breath.     albuterol (5 MG/ML) 0.5% nebulizer solution  Commonly known as:  PROVENTIL  Take 2.5 mg by nebulization 3 (three) times daily as needed. For shortness of breath.     ALPRAZolam 0.5 MG tablet  Commonly known as:  XANAX  Take 0.5 mg by mouth 3 (three) times daily as needed. For anxiety.     budesonide-formoterol 160-4.5 MCG/ACT inhaler  Commonly known as:  SYMBICORT  Inhale 2 puffs into the lungs 2 (two) times daily.     DAYQUIL PO  Take 2 tablets by mouth daily as needed. For flu and cold symptoms     FLUoxetine 40 MG capsule  Commonly known as:  PROZAC  Take 80 mg by mouth daily.     levalbuterol 45 MCG/ACT inhaler  Commonly known as:  XOPENEX HFA  Inhale 2 puffs into the lungs every 6 (six) hours as needed for wheezing.     lisinopril 10 MG tablet  Commonly known as:  PRINIVIL,ZESTRIL  Take 10 mg by mouth daily.     nebivolol 5 MG tablet  Commonly known as:  BYSTOLIC  Take 5 mg by mouth every evening.     NYQUIL PO  Take 2 tablets by mouth daily as needed. For flu and cold symptoms     predniSONE 20 MG tablet  Commonly known as:  DELTASONE  Take 1 tablet (20 mg total) by mouth daily with breakfast. 40 mg (2 tabs)qd 4 days, 20 mg (1 tab)qd next 4 days, 10 mg (half a tab)qd 4 days.     tiotropium 18 MCG inhalation capsule  Commonly known as:  SPIRIVA  Place 18 mcg into inhaler and inhale daily.         Disposition and follow-up:   Ms.Wrenn N Rix was discharged from [facility] in Stable condition.  At the hospital follow up visit please address:  1.  COPD: Please follow up on patient's symptoms of SOB, DOE, and her tolerance of activity on 4L O2. Patient will also need referral to pulmonology, she does state that she did not work well with her previous pulmonologist. Please also follow up on patient's outpatient pulmonary rehab. Patient was discharged on tapering prednisone: 40 mg once daily for 4 days, 20 mg once daily for 4 days, 10 mg once daily for 4 days.  Anxiety: Patient states that stress/anxiety are a contributing factor to her COPD exacerbation as well as having her grandchildren at home. Please consider a psychology referral if deemed appropriate.  2.  Labs / imaging needed at time of follow-up: CBC  3.  Pending labs/ test needing follow-up: none  Follow-up Appointments: Follow-up Information    Follow up with HOUT, BRITTANY, PA-C. Schedule an appointment as soon as possible for a visit in 1 week.   Specialty:  Physician Assistant   Contact information:   [facility] [address] [place] [address]  [phone]       Discharge Instructions: Discharge Instructions    Call MD for:  difficulty breathing, headache or visual disturbances    Complete by:  As directed      Call MD for:  extreme fatigue    Complete by:  As directed      Call MD for:  persistant dizziness or light-headedness    Complete by:  As directed      Diet - low sodium heart healthy    Complete by:  As directed      Face-to-face encounter (required for Medicare/Medicaid patients)    Complete by:  As directed   I [name] certify that this patient is under my care and that I, or a nurse practitioner or physician's assistant working with me, had a face-to-face encounter that meets the physician face-to-face encounter requirements with this patient on 12/01/2014.  The encounter with the patient was in whole, or in part for the following medical condition(s) which is the primary reason for home health care (List medical condition): COPD exacerbation.  Will need outpatient PT for pulmonary rehab, mobility/OOB.  The encounter with the patient was in whole, or in part, for the following medical condition, which is the primary reason for home health care:  COPD exacerbation  I certify that, based on my findings, the following services are medically necessary home health services:  Physical therapy  Reason for Medically Necessary Home Health Services:  Other See Comments  My clinical findings support the need for the above services:  Shortness of breath with activity  Further, I certify that my clinical findings support that this patient is homebound due to:  Shortness of Breath with activity     Home Health    Complete by:  As directed   To provide the following care/treatments:  PT     Increase activity slowly    Complete by:  As directed            Consultations:    Procedures Performed:  X-ray Chest Pa And Lateral  11/30/2014   CLINICAL DATA:  Shortness of Breath  EXAM: CHEST - 2 VIEW  COMPARISON:  11/30/2014, 120 hours  FINDINGS: Cardiac shadow is stable. The lungs are again hyperinflated. Mild interstitial changes are seen similar to that noted on the prior study. No focal infiltrate or sizable effusion is seen.  IMPRESSION: COPD without acute abnormality.   Electronically Signed   By: Alcide Clever M.D.   On: 11/30/2014 09:07   Dg Chest Portable 1 View  11/30/2014   CLINICAL DATA:  Shortness of breath.  EXAM: PORTABLE CHEST - 1 VIEW  COMPARISON:  04/09/2014  FINDINGS: Normal heart size and pulmonary vascularity. Emphysematous changes in the lungs. Diffuse interstitial fibrosis. No focal consolidation. No blunting of costophrenic angles. No pneumothorax. Mediastinal contours appear intact.  IMPRESSION: Emphysematous changes and interstitial fibrosis in  the lungs. No focal consolidation.   Electronically Signed   By: Burman Nieves M.D.   On: 11/30/2014 01:43    2D Echo: n/a  Cardiac Cath: n/a  Admission HPI: [name] is a 64 y.o. Caucasian female with past medical history of COPD, hypertension, and anxiety who presents with worsening shortness of breath. Patient has PFTs from November 2010 that show an FEV1 of 38%, classifying her as GOLD Stage III, severe COPD. She reports that her symptoms of shortness of breath started around 12pm on Monday afternoon and progressively worsened throughout the course of day and  night until she decided she needed to be seen in the emergency department. States that she is compliant with her home inhalers and also requires the use of home O2 continuously at 4L. She reports that her shortness of breath is aggravated by stress which causes her to become anxious and ultimately short of breath. She attributes this most recent exacerbation to stress at home where she lives with her husband and her daughter. She also states that she recently was switched by her PCP from TID dosing to once daily extended release dosing of her Xanax and believes this to not be as effective. She denies any recent cough or increase in sputum production, denies fever or chills, denies nausea or vomiting. She states she quit smoking approximately 6-7 years ago but is unfortunately still exposed to second hand smoke by those around her in the household.   In the ED, patient received albuterol nebs x 2, Atrovent neb x 1, and prednisone 60mg  PO once. She attempted to ambulate in the hallway where her O2 sats dropped to 82%. She also had a portable 1-view chest xray that was read as having emphysematous changes and interstitial fibrosis, but no focal consolidation. She reported an improvement of her symptoms with these internventions and stated that her breathing was only mildly labored. On exam she was maintaining O2 sats of 98% on  4L via nasal cannula.  Hospital Course by problem list:   COPD exacerbation: Patient was continued on home 4L O2 via nasal cannula. She was started on Symbicort inhaler, Duonebs, and Prednisone 40 mg daily. Patient received PT treatment and walked well satting around 95% on nasal cannula. Patient's symptoms improved with above treatments and she reports she was near baseline and ready to return to home. She was discharged on tapering Prednisone over 2 weeks as she says she does better with longer course compared to short course.  Discharge Vitals:   BP 169/81 mmHg  Pulse 92  Temp(Src) 98 F (36.7 C) (Oral)  Resp 18  Ht 5\' 3"  (1.6 m)  Wt 128 lb 8.5 oz (58.3 kg)  BMI 22.77 kg/m2  SpO2 99%  Discharge Labs:  Results for orders placed or performed during the hospital encounter of 11/30/14 (from the past 24 hour(s))  CBC with Differential/Platelet     Status: Abnormal   Collection Time: 12/01/14  4:45 AM  Result Value Ref Range   WBC 20.4 (H) 4.0 - 10.5 K/uL   RBC 3.61 (L) 3.87 - 5.11 MIL/uL   Hemoglobin 10.3 (L) 12.0 - 15.0 g/dL   HCT 16.1 (L) 09.6 - 04.5 %   MCV 92.0 78.0 - 100.0 fL   MCH 28.5 26.0 - 34.0 pg   MCHC 31.0 30.0 - 36.0 g/dL   RDW 40.9 81.1 - 91.4 %   Platelets 322 150 - 400 K/uL   Neutrophils Relative % 81 (H) 43 - 77 %   Neutro Abs 16.6 (H) 1.7 - 7.7 K/uL   Lymphocytes Relative 11 (L) 12 - 46 %   Lymphs Abs 2.2 0.7 - 4.0 K/uL   Monocytes Relative 8 3 - 12 %   Monocytes Absolute 1.6 (H) 0.1 - 1.0 K/uL   Eosinophils Relative 0 0 - 5 %   Eosinophils Absolute 0.0 0.0 - 0.7 K/uL   Basophils Relative 0 0 - 1 %   Basophils Absolute 0.0 0.0 - 0.1 K/uL    Signed: [name], MD 12/01/2014, 6:52 PM    Services Ordered on Discharge: none Equipment  Ordered on Discharge: none

## 2014-12-01 NOTE — Progress Notes (Signed)
Patient being discharged home with PCP and Pulmonary rehab follow up recommendations will be transported by family, IV removed and no complaints of pain. Discharge summary reviewed with medications.

## 2014-12-01 NOTE — Progress Notes (Signed)
Internal Medicine Attending:   I saw and examined the patient. I reviewed the resident's note and I agree with the resident's findings and plan as documented in the resident's note.  64 year old woman with gold stage III COPD, admitted with exacerbation, now doing much better with systemic steroids. Ambulated yesterday with physical therapy no laboratory hypoxia seen so I think 4 L supplemental oxygen is adequate. Patient prefers a longer taper of prednisone and she says that she worsens with short bursts. Plan for 2 week taper. Advised patient to please follow up with a pulmonologist given her advancing disease. She also undergo another round of pulmonary rehabilitation.

## 2014-12-01 NOTE — Discharge Instructions (Signed)
Please continue to take your home medications regularly as you have.  We will prescribe 2 weeks worth of oral steroid taper.  We will order outpatient pulmonary rehab to help with your ability to walk around and breathe easier.  Please schedule an appointment with your primary doctor at [facility] for a visit in the next 1-2 weeks.

## 2014-12-01 NOTE — Progress Notes (Signed)
   Subjective: Patient states that she is much improved since admission. She walked with PT yesterday down the hall satting >95% on 4L O2 nasal cannula. Objective: Vital signs in last 24 hours: Filed Vitals:   11/30/14 2349 12/01/14 0448 12/01/14 0814 12/01/14 1138  BP:  156/77    Pulse:  85    Temp:  97.3 F (36.3 C)    TempSrc:  Oral    Resp:  18    Height:      Weight:      SpO2: 98% 100% 95% 97%   Weight change:   Intake/Output Summary (Last 24 hours) at 12/01/14 1144 Last data filed at 12/01/14 0449  Gross per 24 hour  Intake   1240 ml  Output      0 ml  Net   1240 ml   General: resting in bed Cardiac: RRR, no rubs, murmurs or gallops Pulm: decreased breath sounds, cleart to auscultation Abd: soft, nontender, nondistended, BS present Ext: warm and well perfused, no pedal edema   Assessment/Plan:  COPD Exacerbation: Patient's symptoms have improved greatly since admission. Although she has a WBC of 20 she denies any productive sputum, fever, chills or other symptoms concerning for pneumonia. 2 view CXR shows COPD without acute abnormality. -Continue 4L oxygen via nasal cannula at home -Will discharge on 2 weeks steroid taper -Continue Albuterol-ipratropium nebulizer, budesonide-formoterol inhaler -Will need referral to pulmonology on outpatient, consider psychology referral for anxiety issues which are an exacerbating factor of her symptoms -Will order outpatient pulmonary rehab    Dispo: Disposition is deferred at this time, awaiting improvement of current medical problems.  Anticipated discharge today.   The patient does have a current PCP ([place], PA-C) and does not need an [facility] hospital follow-up appointment after discharge.   The patient does not have transportation limitations that hinder transportation to clinic appointments.  .Services Needed at time of discharge: Y = Yes, Blank = No PT:   OT:   RN:   Equipment:   Other:     LOS: 1 day    [name], MD 12/01/2014, 11:44 AM

## 2014-12-16 DIAGNOSIS — M1990 Unspecified osteoarthritis, unspecified site: Secondary | ICD-10-CM | POA: Diagnosis not present

## 2014-12-16 DIAGNOSIS — Z6822 Body mass index (BMI) 22.0-22.9, adult: Secondary | ICD-10-CM | POA: Diagnosis not present

## 2014-12-16 DIAGNOSIS — F419 Anxiety disorder, unspecified: Secondary | ICD-10-CM | POA: Diagnosis not present

## 2014-12-16 DIAGNOSIS — J449 Chronic obstructive pulmonary disease, unspecified: Secondary | ICD-10-CM | POA: Diagnosis not present

## 2014-12-16 DIAGNOSIS — Z9981 Dependence on supplemental oxygen: Secondary | ICD-10-CM | POA: Diagnosis not present

## 2015-01-04 DIAGNOSIS — J9611 Chronic respiratory failure with hypoxia: Secondary | ICD-10-CM | POA: Diagnosis not present

## 2015-01-04 DIAGNOSIS — Z23 Encounter for immunization: Secondary | ICD-10-CM | POA: Diagnosis not present

## 2015-01-04 DIAGNOSIS — J449 Chronic obstructive pulmonary disease, unspecified: Secondary | ICD-10-CM | POA: Diagnosis not present

## 2015-01-04 DIAGNOSIS — F17210 Nicotine dependence, cigarettes, uncomplicated: Secondary | ICD-10-CM | POA: Diagnosis not present

## 2015-01-04 DIAGNOSIS — Z716 Tobacco abuse counseling: Secondary | ICD-10-CM | POA: Diagnosis not present

## 2015-01-04 DIAGNOSIS — J988 Other specified respiratory disorders: Secondary | ICD-10-CM | POA: Diagnosis not present

## 2015-01-04 DIAGNOSIS — Z720 Tobacco use: Secondary | ICD-10-CM | POA: Diagnosis not present

## 2015-03-10 ENCOUNTER — Encounter (HOSPITAL_COMMUNITY): Payer: Self-pay | Admitting: *Deleted

## 2015-03-10 ENCOUNTER — Inpatient Hospital Stay (HOSPITAL_COMMUNITY)
Admission: EM | Admit: 2015-03-10 | Discharge: 2015-03-16 | DRG: 871 | Disposition: A | Discharge status: Hospice | Payer: Medicare Other | Attending: Internal Medicine | Admitting: Internal Medicine

## 2015-03-10 ENCOUNTER — Emergency Department (HOSPITAL_COMMUNITY): Payer: Medicare Other

## 2015-03-10 DIAGNOSIS — I4581 Long QT syndrome: Secondary | ICD-10-CM | POA: Diagnosis present

## 2015-03-10 DIAGNOSIS — F410 Panic disorder [episodic paroxysmal anxiety] without agoraphobia: Secondary | ICD-10-CM | POA: Diagnosis present

## 2015-03-10 DIAGNOSIS — Z6822 Body mass index (BMI) 22.0-22.9, adult: Secondary | ICD-10-CM

## 2015-03-10 DIAGNOSIS — R0600 Dyspnea, unspecified: Secondary | ICD-10-CM | POA: Diagnosis not present

## 2015-03-10 DIAGNOSIS — R0602 Shortness of breath: Secondary | ICD-10-CM | POA: Insufficient documentation

## 2015-03-10 DIAGNOSIS — D649 Anemia, unspecified: Secondary | ICD-10-CM | POA: Diagnosis present

## 2015-03-10 DIAGNOSIS — Z66 Do not resuscitate: Secondary | ICD-10-CM | POA: Diagnosis present

## 2015-03-10 DIAGNOSIS — J440 Chronic obstructive pulmonary disease with acute lower respiratory infection: Secondary | ICD-10-CM | POA: Diagnosis present

## 2015-03-10 DIAGNOSIS — R64 Cachexia: Secondary | ICD-10-CM | POA: Diagnosis present

## 2015-03-10 DIAGNOSIS — Z7189 Other specified counseling: Secondary | ICD-10-CM | POA: Insufficient documentation

## 2015-03-10 DIAGNOSIS — Z791 Long term (current) use of non-steroidal anti-inflammatories (NSAID): Secondary | ICD-10-CM | POA: Diagnosis not present

## 2015-03-10 DIAGNOSIS — J441 Chronic obstructive pulmonary disease with (acute) exacerbation: Secondary | ICD-10-CM | POA: Diagnosis not present

## 2015-03-10 DIAGNOSIS — Z87891 Personal history of nicotine dependence: Secondary | ICD-10-CM | POA: Diagnosis not present

## 2015-03-10 DIAGNOSIS — R918 Other nonspecific abnormal finding of lung field: Secondary | ICD-10-CM | POA: Diagnosis not present

## 2015-03-10 DIAGNOSIS — R9431 Abnormal electrocardiogram [ECG] [EKG]: Secondary | ICD-10-CM | POA: Diagnosis present

## 2015-03-10 DIAGNOSIS — R739 Hyperglycemia, unspecified: Secondary | ICD-10-CM | POA: Diagnosis present

## 2015-03-10 DIAGNOSIS — J449 Chronic obstructive pulmonary disease, unspecified: Secondary | ICD-10-CM | POA: Diagnosis not present

## 2015-03-10 DIAGNOSIS — I10 Essential (primary) hypertension: Secondary | ICD-10-CM | POA: Diagnosis present

## 2015-03-10 DIAGNOSIS — I272 Other secondary pulmonary hypertension: Secondary | ICD-10-CM | POA: Diagnosis not present

## 2015-03-10 DIAGNOSIS — F329 Major depressive disorder, single episode, unspecified: Secondary | ICD-10-CM | POA: Diagnosis present

## 2015-03-10 DIAGNOSIS — A419 Sepsis, unspecified organism: Principal | ICD-10-CM | POA: Diagnosis present

## 2015-03-10 DIAGNOSIS — I509 Heart failure, unspecified: Secondary | ICD-10-CM | POA: Diagnosis not present

## 2015-03-10 DIAGNOSIS — F418 Other specified anxiety disorders: Secondary | ICD-10-CM | POA: Diagnosis not present

## 2015-03-10 DIAGNOSIS — T380X5A Adverse effect of glucocorticoids and synthetic analogues, initial encounter: Secondary | ICD-10-CM | POA: Diagnosis present

## 2015-03-10 DIAGNOSIS — Z515 Encounter for palliative care: Secondary | ICD-10-CM | POA: Insufficient documentation

## 2015-03-10 DIAGNOSIS — F419 Anxiety disorder, unspecified: Secondary | ICD-10-CM | POA: Diagnosis not present

## 2015-03-10 DIAGNOSIS — J9620 Acute and chronic respiratory failure, unspecified whether with hypoxia or hypercapnia: Secondary | ICD-10-CM | POA: Diagnosis present

## 2015-03-10 DIAGNOSIS — F32A Depression, unspecified: Secondary | ICD-10-CM | POA: Diagnosis present

## 2015-03-10 DIAGNOSIS — E876 Hypokalemia: Secondary | ICD-10-CM | POA: Diagnosis present

## 2015-03-10 DIAGNOSIS — J189 Pneumonia, unspecified organism: Secondary | ICD-10-CM | POA: Diagnosis present

## 2015-03-10 DIAGNOSIS — Z720 Tobacco use: Secondary | ICD-10-CM | POA: Diagnosis not present

## 2015-03-10 DIAGNOSIS — R000 Tachycardia, unspecified: Secondary | ICD-10-CM | POA: Diagnosis not present

## 2015-03-10 DIAGNOSIS — J9621 Acute and chronic respiratory failure with hypoxia: Secondary | ICD-10-CM | POA: Diagnosis present

## 2015-03-10 DIAGNOSIS — Z9981 Dependence on supplemental oxygen: Secondary | ICD-10-CM | POA: Diagnosis not present

## 2015-03-10 DIAGNOSIS — F17210 Nicotine dependence, cigarettes, uncomplicated: Secondary | ICD-10-CM | POA: Diagnosis present

## 2015-03-10 DIAGNOSIS — Z7952 Long term (current) use of systemic steroids: Secondary | ICD-10-CM | POA: Diagnosis not present

## 2015-03-10 DIAGNOSIS — R0603 Acute respiratory distress: Secondary | ICD-10-CM

## 2015-03-10 HISTORY — DX: Anxiety disorder, unspecified: F41.9

## 2015-03-10 HISTORY — DX: Chronic respiratory failure, unspecified whether with hypoxia or hypercapnia: J96.10

## 2015-03-10 HISTORY — DX: Pneumonia, unspecified organism: J18.9

## 2015-03-10 HISTORY — DX: Sepsis, unspecified organism: A41.9

## 2015-03-10 HISTORY — DX: Major depressive disorder, single episode, unspecified: F32.9

## 2015-03-10 HISTORY — DX: Acute and chronic respiratory failure, unspecified whether with hypoxia or hypercapnia: J96.20

## 2015-03-10 HISTORY — DX: Depression, unspecified: F32.A

## 2015-03-10 LAB — CBC WITH DIFFERENTIAL/PLATELET
BASOS ABS: 0 10*3/uL (ref 0.0–0.1)
BASOS PCT: 0 %
Eosinophils Absolute: 0.1 10*3/uL (ref 0.0–0.7)
Eosinophils Relative: 0 %
HEMATOCRIT: 36.3 % (ref 36.0–46.0)
HEMOGLOBIN: 11.7 g/dL — AB (ref 12.0–15.0)
Lymphocytes Relative: 3 %
Lymphs Abs: 1.1 10*3/uL (ref 0.7–4.0)
MCH: 30.4 pg (ref 26.0–34.0)
MCHC: 32.2 g/dL (ref 30.0–36.0)
MCV: 94.3 fL (ref 78.0–100.0)
Monocytes Absolute: 1.1 10*3/uL — ABNORMAL HIGH (ref 0.1–1.0)
Monocytes Relative: 3 %
NEUTROS ABS: 30.9 10*3/uL — AB (ref 1.7–7.7)
NEUTROS PCT: 93 %
Platelets: 377 10*3/uL (ref 150–400)
RBC: 3.85 MIL/uL — ABNORMAL LOW (ref 3.87–5.11)
RDW: 12.8 % (ref 11.5–15.5)
WBC: 33.1 10*3/uL — ABNORMAL HIGH (ref 4.0–10.5)

## 2015-03-10 LAB — BASIC METABOLIC PANEL
ANION GAP: 9 (ref 5–15)
BUN: 23 mg/dL — ABNORMAL HIGH (ref 6–20)
CHLORIDE: 97 mmol/L — AB (ref 101–111)
CO2: 31 mmol/L (ref 22–32)
Calcium: 9.2 mg/dL (ref 8.9–10.3)
Creatinine, Ser: 0.95 mg/dL (ref 0.44–1.00)
GFR calc non Af Amer: >60 mL/min (ref >60)
Glucose, Bld: 187 mg/dL — ABNORMAL HIGH (ref 65–99)
POTASSIUM: 3.4 mmol/L — AB (ref 3.5–5.1)
Sodium: 137 mmol/L (ref 135–145)

## 2015-03-10 LAB — I-STAT ARTERIAL BLOOD GAS, ED
ACID-BASE EXCESS: 6 mmol/L — AB (ref 0.0–2.0)
Acid-Base Excess: 5 mmol/L — ABNORMAL HIGH (ref 0.0–2.0)
Acid-Base Excess: 3 mmol/L — ABNORMAL HIGH (ref 0.0–2.0)
BICARBONATE: 34.1 meq/L — AB (ref 20.0–24.0)
Bicarbonate: 32 mEq/L — ABNORMAL HIGH (ref 20.0–24.0)
Bicarbonate: 27.3 mEq/L — ABNORMAL HIGH (ref 20.0–24.0)
O2 SAT: 98 %
O2 SAT: 98 %
O2 Saturation: 99 %
PCO2 ART: 64.4 mmHg — AB (ref 35.0–45.0)
PCO2 ART: 40.1 mmHg (ref 35.0–45.0)
PH ART: 7.314 — AB (ref 7.350–7.450)
PH ART: 7.441 (ref 7.350–7.450)
PO2 ART: 105 mmHg — AB (ref 80.0–100.0)
Patient temperature: 102.5
Patient temperature: 98.6
TCO2: 36 mmol/L (ref 0–100)
TCO2: 34 mmol/L (ref 0–100)
TCO2: 28 mmol/L (ref 0–100)
pCO2 arterial: 63.9 mmHg (ref 35.0–45.0)
pH, Arterial: 7.335 — ABNORMAL LOW (ref 7.350–7.450)
pO2, Arterial: 149 mmHg — ABNORMAL HIGH (ref 80.0–100.0)
pO2, Arterial: 134 mmHg — ABNORMAL HIGH (ref 80.0–100.0)

## 2015-03-10 LAB — STREP PNEUMONIAE URINARY ANTIGEN: Strep Pneumo Urinary Antigen: NEGATIVE

## 2015-03-10 LAB — PROTIME-INR
INR: 1.02 (ref 0.00–1.49)
Prothrombin Time: 13.6 seconds (ref 11.6–15.2)

## 2015-03-10 LAB — URINALYSIS, ROUTINE W REFLEX MICROSCOPIC
BILIRUBIN URINE: NEGATIVE
Glucose, UA: 250 mg/dL — AB
KETONES UR: NEGATIVE mg/dL
Leukocytes, UA: NEGATIVE
NITRITE: NEGATIVE
PH: 5 (ref 5.0–8.0)
Protein, ur: 30 mg/dL — AB
Specific Gravity, Urine: 1.025 (ref 1.005–1.030)

## 2015-03-10 LAB — I-STAT CG4 LACTIC ACID, ED: Lactic Acid, Venous: 1.99 mmol/L (ref 0.5–2.0)

## 2015-03-10 LAB — GLUCOSE, CAPILLARY
GLUCOSE-CAPILLARY: 140 mg/dL — AB (ref 65–99)
GLUCOSE-CAPILLARY: 109 mg/dL — AB (ref 65–99)

## 2015-03-10 LAB — CBG MONITORING, ED: Glucose-Capillary: 125 mg/dL — ABNORMAL HIGH (ref 65–99)

## 2015-03-10 LAB — URINE MICROSCOPIC-ADD ON

## 2015-03-10 LAB — MAGNESIUM: Magnesium: 2 mg/dL (ref 1.7–2.4)

## 2015-03-10 LAB — APTT: APTT: 29 s (ref 24–37)

## 2015-03-10 LAB — TROPONIN I: TROPONIN I: 0.17 ng/mL — AB (ref <0.031)

## 2015-03-10 LAB — BRAIN NATRIURETIC PEPTIDE: B NATRIURETIC PEPTIDE 5: 178.7 pg/mL — AB (ref 0.0–100.0)

## 2015-03-10 LAB — TSH: TSH: 0.611 u[IU]/mL (ref 0.350–4.500)

## 2015-03-10 LAB — PROCALCITONIN: Procalcitonin: 0.24 ng/mL

## 2015-03-10 LAB — I-STAT TROPONIN, ED: Troponin i, poc: 0.07 ng/mL (ref 0.00–0.08)

## 2015-03-10 MED ORDER — ENOXAPARIN SODIUM 30 MG/0.3ML ~~LOC~~ SOLN
30.0000 mg | Freq: Every day | SUBCUTANEOUS | Status: DC
  Administered 2015-03-10 – 2015-03-15 (×6): 30 mg via SUBCUTANEOUS
  Filled 2015-03-10 (×6): qty 0.3

## 2015-03-10 MED ORDER — LABETALOL HCL 5 MG/ML IV SOLN: 10.0000 mg | Freq: Once | INTRAVENOUS | Status: DC

## 2015-03-10 MED ORDER — SODIUM CHLORIDE 0.9 % IV SOLN
INTRAVENOUS | Status: DC
  Administered 2015-03-10 (×2): via INTRAVENOUS
  Administered 2015-03-10: 125 mL/h via INTRAVENOUS
  Administered 2015-03-12: 16:00:00 via INTRAVENOUS

## 2015-03-10 MED ORDER — MELOXICAM 7.5 MG PO TABS
15.0000 mg | ORAL_TABLET | Freq: Every day | ORAL | Status: DC
  Administered 2015-03-10 – 2015-03-16 (×7): 15 mg via ORAL
  Filled 2015-03-10: qty 2
  Filled 2015-03-10: qty 1
  Filled 2015-03-10 (×5): qty 2

## 2015-03-10 MED ORDER — HYDRALAZINE HCL 20 MG/ML IJ SOLN
5.0000 mg | Freq: Four times a day (QID) | INTRAMUSCULAR | Status: DC | PRN
  Administered 2015-03-12 – 2015-03-14 (×5): 5 mg via INTRAVENOUS
  Filled 2015-03-10 (×5): qty 1

## 2015-03-10 MED ORDER — IPRATROPIUM-ALBUTEROL 0.5-2.5 (3) MG/3ML IN SOLN
3.0000 mL | RESPIRATORY_TRACT | Status: DC
  Administered 2015-03-10 – 2015-03-16 (×36): 3 mL via RESPIRATORY_TRACT
  Filled 2015-03-10 (×37): qty 3

## 2015-03-10 MED ORDER — SODIUM CHLORIDE 0.9 % IV BOLUS (SEPSIS)
1000.0000 mL | Freq: Once | INTRAVENOUS | Status: AC
Start: 2015-03-10 — End: 2015-03-10
  Administered 2015-03-10: 1000 mL via INTRAVENOUS

## 2015-03-10 MED ORDER — INSULIN ASPART 100 UNIT/ML ~~LOC~~ SOLN: 0.0000 [IU] | Freq: Every day | SUBCUTANEOUS | Status: DC

## 2015-03-10 MED ORDER — ALPRAZOLAM 0.5 MG PO TABS
0.5000 mg | ORAL_TABLET | Freq: Three times a day (TID) | ORAL | Status: DC | PRN
  Administered 2015-03-10 – 2015-03-14 (×8): 0.5 mg via ORAL
  Filled 2015-03-10 (×8): qty 1

## 2015-03-10 MED ORDER — FLUOXETINE HCL 20 MG PO CAPS
80.0000 mg | ORAL_CAPSULE | Freq: Every day | ORAL | Status: DC
  Administered 2015-03-11 – 2015-03-16 (×6): 80 mg via ORAL
  Filled 2015-03-10 (×8): qty 4

## 2015-03-10 MED ORDER — POTASSIUM CHLORIDE CRYS ER 20 MEQ PO TBCR
40.0000 meq | EXTENDED_RELEASE_TABLET | Freq: Two times a day (BID) | ORAL | Status: AC
  Administered 2015-03-10 – 2015-03-11 (×2): 40 meq via ORAL
  Filled 2015-03-10 (×2): qty 2

## 2015-03-10 MED ORDER — ACETAMINOPHEN 650 MG RE SUPP
650.0000 mg | Freq: Once | RECTAL | Status: AC
  Administered 2015-03-10: 650 mg via RECTAL
  Filled 2015-03-10: qty 1

## 2015-03-10 MED ORDER — SODIUM CHLORIDE 0.9 % IV BOLUS (SEPSIS)
1000.0000 mL | Freq: Once | INTRAVENOUS | Status: AC
  Administered 2015-03-10: 1000 mL via INTRAVENOUS

## 2015-03-10 MED ORDER — PIPERACILLIN-TAZOBACTAM 3.375 G IVPB
3.3750 g | Freq: Three times a day (TID) | INTRAVENOUS | Status: DC
  Administered 2015-03-10 – 2015-03-16 (×18): 3.375 g via INTRAVENOUS
  Filled 2015-03-10 (×20): qty 50

## 2015-03-10 MED ORDER — VANCOMYCIN HCL IN DEXTROSE 1-5 GM/200ML-% IV SOLN
1000.0000 mg | Freq: Once | INTRAVENOUS | Status: AC
  Administered 2015-03-10: 1000 mg via INTRAVENOUS
  Filled 2015-03-10: qty 200

## 2015-03-10 MED ORDER — PIPERACILLIN-TAZOBACTAM 3.375 G IVPB
3.3750 g | Freq: Once | INTRAVENOUS | Status: AC
  Administered 2015-03-10: 3.375 g via INTRAVENOUS
  Filled 2015-03-10: qty 50

## 2015-03-10 MED ORDER — METHYLPREDNISOLONE SODIUM SUCC 125 MG IJ SOLR
60.0000 mg | Freq: Two times a day (BID) | INTRAMUSCULAR | Status: DC
  Administered 2015-03-10 – 2015-03-16 (×12): 60 mg via INTRAVENOUS
  Filled 2015-03-10 (×13): qty 2

## 2015-03-10 MED ORDER — SODIUM CHLORIDE 0.9 % IV SOLN
500.0000 mg | Freq: Two times a day (BID) | INTRAVENOUS | Status: DC
  Administered 2015-03-10 – 2015-03-13 (×6): 500 mg via INTRAVENOUS
  Filled 2015-03-10 (×9): qty 500

## 2015-03-10 MED ORDER — LISINOPRIL 10 MG PO TABS
10.0000 mg | ORAL_TABLET | Freq: Every day | ORAL | Status: DC
  Administered 2015-03-11 – 2015-03-15 (×5): 10 mg via ORAL
  Filled 2015-03-10 (×5): qty 1

## 2015-03-10 MED ORDER — INSULIN ASPART 100 UNIT/ML ~~LOC~~ SOLN
0.0000 [IU] | Freq: Three times a day (TID) | SUBCUTANEOUS | Status: DC
  Administered 2015-03-10 – 2015-03-14 (×5): 3 [IU] via SUBCUTANEOUS

## 2015-03-10 MED ORDER — BUDESONIDE-FORMOTEROL FUMARATE 160-4.5 MCG/ACT IN AERO
2.0000 | INHALATION_SPRAY | Freq: Two times a day (BID) | RESPIRATORY_TRACT | Status: DC
  Administered 2015-03-10 – 2015-03-11 (×3): 2 via RESPIRATORY_TRACT
  Filled 2015-03-10: qty 6

## 2015-03-10 NOTE — Progress Notes (Signed)
Patient came in respiratory distress. Patient placed on BIPAP per MD verbal order. Patient placed on 12/6 and 50%. CXR and ABG Pending.

## 2015-03-10 NOTE — H&P (Signed)
[facility] History and Physical  [name] FAO:[id number] DOB: October 31, 1950 DOA: 03/10/2015  Referring physician: ward PCP: [name], PA-C   Chief Complaint: shortness of breath  HPI: Diamond Glass is a  64 y.o. female with a history of COPD on 4 L of oxygen at home, hypertension, anxiety and pneumonia presents to the emergency department from home with the chief complaint of worsening shortness of breath. Initial evaluation in the emergency department reveals acute on chronic respiratory failure and sepsis related to pneumonia and COPD exacerbation.  Patient reports worsening shortness of breath developed yesterday. Early this morning she awakened and respiratory distress and EMS was called. Reportedly her respiratory rate was 22 oxygen saturation level 86% 4 L nasal cannula with diminished breath sounds. She reports she continues to smoke the occasional cigarette. She denies any recent fever chills sick contacts. She denies any chest pain palpitations diaphoresis headache dizziness syncope or near-syncope. She denies any abdominal pain nausea vomiting dysuria hematuria frequency or urgency. He reports she has been in the hospital more recent than our records indicate and she frequently goes to [place]. Chart review indicates she has been intubated in the past but she conveys to me she does not wish to be intubated.  Workup in the emergency department includes arterial blood gas with a pH of 7.33, PCO2 63.9, O2 149 on BiPAP, initial troponin negative, BNP 178, basic metabolic panel significant for potassium of 3.4 serum glucose 187 chloride 97, CBC with WBC 33.1 hemoglobin 11.7, lactic acid 1.99 Percocet tone and within the limits of normal, chest x-ray reveals right base infiltrate superimposed on severe COPD aspiration. EKG with sinus tach borderline QT prolongation similar to previous.  Upon presentation she is febrile hemodynamically stable tachycardic tachypnia with oxygen  saturation 88%. She is provided with Tylenol, IV fluids antibiotics, Solu-Medrol and nebulizers.  At the time of my exam she is alert and oriented in no respiratory distress on BiPAP with an oxygen saturation level 100%.  Review of Systems:  10 point review of systems complete and all systems are negative except as indicated in the history of present illness  Past Medical History  Diagnosis Date  . COPD (chronic obstructive pulmonary disease) (HCC)   . Hypertension   . Chronic respiratory failure (HCC)   . Anxiety   . Acute on chronic respiratory failure (HCC)   . Sepsis (HCC)   . PNA (pneumonia)    Past Surgical History  Procedure Laterality Date  . Abdominal hysterectomy     Social History:  reports that she quit smoking about 6 years ago. Her smoking use included Cigarettes. She has a 200 pack-year smoking history. She does not have any smokeless tobacco history on file. She reports that she does not drink alcohol or use illicit drugs. She continues to smoke she lives at home she wears nasal cannula at 4 L at baseline Allergies  Allergen Reactions  . Codeine     REACTION: vomit  . Morphine And Related Other (See Comments)    "makes me crazy"    No family history on file. able to obtain family medical history do to acute illness  Prior to Admission medications   Medication Sig Start Date End Date Taking? Authorizing Provider  albuterol (PROVENTIL HFA;VENTOLIN HFA) 108 (90 BASE) MCG/ACT inhaler Inhale 1-2 puffs into the lungs every 6 (six) hours as needed for wheezing or shortness of breath.   Yes Historical Provider, MD  ALPRAZolam [name]) 0.5 MG tablet Take 0.5 mg  by mouth 3 (three) times daily as needed. For anxiety.    Yes Historical Provider, MD  budesonide-formoterol (SYMBICORT) 160-4.5 MCG/ACT inhaler Inhale 2 puffs into the lungs 2 (two) times daily.     Yes Historical Provider, MD  FLUoxetine (PROZAC) 40 MG capsule Take 80 mg by mouth daily.   Yes Historical Provider,  MD  lisinopril (PRINIVIL,ZESTRIL) 10 MG tablet Take 10 mg by mouth daily.   Yes Historical Provider, MD  meloxicam (MOBIC) 15 MG tablet Take 15 mg by mouth daily.   Yes Historical Provider, MD  predniSONE (DELTASONE) 10 MG tablet Take 10 mg by mouth daily with breakfast.   Yes Historical Provider, MD  tiotropium (SPIRIVA) 18 MCG inhalation capsule Place 18 mcg into inhaler and inhale daily.     Yes Historical Provider, MD  predniSONE (DELTASONE) 20 MG tablet Take 1 tablet (20 mg total) by mouth daily with breakfast. 40 mg (2 tabs)qd 4 days, 20 mg (1 tab)qd next 4 days, 10 mg (half a tab)qd 4 days. Patient not taking: Reported on 03/10/2015 12/01/14   Diamond McleanVishal Patel, MD   Physical Exam: Filed Vitals:   03/10/15 0627 03/10/15 0646 03/10/15 0700 03/10/15 0745  BP: 124/64  154/72 142/73  Pulse: 97 91  88  Temp: 100.4 F (38 C)     TempSrc: Rectal     Resp: 20 16  16   Height:      Weight:      SpO2: 100% 100%  100%    Wt Readings from Last 3 Encounters:  03/10/15 56.7 kg (125 lb)  11/30/14 58.3 kg (128 lb 8.5 oz)  04/10/14 60.011 kg (132 lb 4.8 oz)    General:  Appears calm and comfortable Eyes: PERRL, normal lids, irises & conjunctiva ENT: grossly normal hearing, lips & tongue Neck: no LAD, masses or thyromegaly Cardiovascular: Tachycardic but regular, no m/r/g. No LE edema.  Respiratory: Mild increased work of breathing with conversation. On BiPAP. Breath sounds quite diminished throughout some coarseness and rhonchi noted currently no crackles minimal wheeze Abdomen: soft, ntnd no guarding or rebounding Skin: no rash or induration seen on limited exam Musculoskeletal: grossly normal tone BUE/BLE Psychiatric: grossly normal mood and affect, speech fluent and appropriate Neurologic: grossly non-focal. Follows commands moves all extremities speech clear           Labs on Admission:  Basic Metabolic Panel:  Recent Labs Lab 03/10/15 0427  NA 137  K 3.4*  CL 97*  CO2 31    GLUCOSE 187*  BUN 23*  CREATININE 0.95  CALCIUM 9.2   Liver Function Tests: No results for input(s): AST, ALT, ALKPHOS, BILITOT, PROT, ALBUMIN in the last 168 hours. No results for input(s): LIPASE, AMYLASE in the last 168 hours. No results for input(s): AMMONIA in the last 168 hours. CBC:  Recent Labs Lab 03/10/15 0427  WBC 33.1*  NEUTROABS 30.9*  HGB 11.7*  HCT 36.3  MCV 94.3  PLT 377   Cardiac Enzymes: No results for input(s): CKTOTAL, CKMB, CKMBINDEX, TROPONINI in the last 168 hours.  BNP (last 3 results)  Recent Labs  04/09/14 1959 03/10/15 0427  BNP 120.6* 178.7*    ProBNP (last 3 results) No results for input(s): PROBNP in the last 8760 hours.   CREATININE: 0.95 (03/10/15 0427) Estimated creatinine clearance - 46.1 mL/min  CBG: No results for input(s): GLUCAP in the last 168 hours.  Radiological Exams on Admission: Dg Chest Port 1 View  03/10/2015  CLINICAL DATA:  Dyspnea, onset tonight.  EXAM: PORTABLE CHEST 1 VIEW COMPARISON:  11/30/2014 FINDINGS: There is patchy airspace opacity in the lateral right base, superimposed on marked hyperinflation and emphysematous changes. Fibrotic appearing interstitial coarsening is present. No large effusions. No pneumothorax. IMPRESSION: Right base infiltrate superimposed on severe COPD, likely pneumonia. Electronically Signed   By: Ellery Plunk M.D.   On: 03/10/2015 04:05    EKG: Independently reviewed.ST with QT prolongation   Assessment/Plan Principal Problem:   Acute on chronic respiratory failure (HCC) Active Problems:   Essential hypertension   COPD exacerbation (HCC)   Community acquired pneumonia   Chronic anemia   Anxiety and depression   History of tobacco abuse   Prolonged QT interval   Sepsis (HCC)   Hypokalemia   Hyperglycemia  #1. Acute on chronic respiratory failure related to COPD exacerbation in the setting of pneumonia per chest x-ray -Improving at the time of admission -Continue  BiPAP and wean as able -Admit to step down -Provide steroids nebulizers continues spiriva -Chart review indicates in 2010  FEV1 38% -Repeat ABG this afternoon -Repeat chest x-ray in the morning -We'll need close outpatient follow-up with pulmonology. I believe she has one and [place]  #2. Sepsis related to pneumonia -She received 2 L of normal saline in the emergency department -We'll continue antibiotics per protocol -Tylenol for fever -Blood cultures pending -Urinalysis pending -On admission lactic acid and pro-calcitonin within the limits of normal. We'll cycle lactic acid -Hemodynamically stable on admission  #3. Pneumonia. Per chest x-ray -Antibiotics per protocol -Blood cultures pending -Sputum culture as able -Strep pneumo and Legionella urine antigens pending  #4. COPD exacerbation likely triggered by a #2 -Chart review indicates cold stage III, severe COPD -She is on 4 L nasal cannula at home -Nebulizers and Symbicort as noted above -BiPAP as noted in #1  #5. Hypokalemia. Mild -Replete and recheck  #6. Hyperglycemia related to steroids -Patient on chronic steroids low-dose at home -Providing Solu-Medrol as noted above -We'll monitor CBG and use sliding scale insulin as indicated  #7. Hypertension. -Controlled in the emergency department after a dose of labetalol -We'll continue lisinopril which is on her home medications -Provide when necessary hydralazine -Chart review indicates she has taken Bystolic in the past #8. Prolonged QT. Mild per EKG -History of same -Current EKG similar to past -No chest pain -Initial troponin negative -We'll cycle  #9. Anemia. Normocytic. Likely related to chronic disease -Current hemoglobin at baseline -Monitor with hydration -No signs symptoms of active bleeding -FOBT  #10. Anxiety. Chronic -Stable at baseline -We'll continue home benzos -Contributes to COPD exacerbation -Continue Prozac as well  #11. Tobacco use.  Continues to smoke -Cessation counseling offered    Code Status: limited DVT Prophylaxis: Family Communication: none present Disposition Plan: home 2-3 days  Time spent: 70 minutes  [facility]

## 2015-03-10 NOTE — Progress Notes (Signed)
ANTIBIOTIC CONSULT NOTE - INITIAL  Pharmacy Consult for Vancomycin and Zosyn  Indication: rule out sepsis  Allergies  Allergen Reactions  . Codeine     REACTION: vomit  . Morphine And Related Other (See Comments)    "makes me crazy"    Patient Measurements: Height: 5\' 2"  (157.5 cm) Weight: 125 lb (56.7 kg) IBW/kg (Calculated) : 50.1  Vital Signs: Temp: 100.4 F (38 C) (12/01 0627) Temp Source: Rectal (12/01 0627) BP: 154/72 mmHg (12/01 0700) Pulse Rate: 91 (12/01 0646) Intake/Output from previous day: 11/30 0701 - 12/01 0700 In: 1000 [I.V.:1000] Out: -  Intake/Output from this shift:    Labs:  Recent Labs  03/10/15 0427  WBC 33.1*  HGB 11.7*  PLT 377  CREATININE 0.95   Estimated Creatinine Clearance: 46.1 mL/min (by C-G formula based on Cr of 0.95). No results for input(s): VANCOTROUGH, VANCOPEAK, VANCORANDOM, GENTTROUGH, GENTPEAK, GENTRANDOM, TOBRATROUGH, TOBRAPEAK, TOBRARND, AMIKACINPEAK, AMIKACINTROU, AMIKACIN in the last 72 hours.   Microbiology: No results found for this or any previous visit (from the past 720 hour(s)).  Medical History: Past Medical History  Diagnosis Date  . COPD (chronic obstructive pulmonary disease) (HCC)   . Hypertension   . Chronic respiratory failure (HCC)   . Anxiety   . Acute on chronic respiratory failure (HCC)   . Sepsis (HCC)   . PNA (pneumonia)     Medications:  Prednisone  ALbuterol  Xanax  Symbicort  Prozac  Zestril  Mobic  Spiriva  Assessment: 64 y.o. female with SOB/COPD, possible sepsis, for empiric antibiotics Vancomycin 1 g IV given in ED at 0600  Goal of Therapy:  Vancomycin trough level 15-20 mcg/ml  Plan:  Vancomycin 500 mg IV q12h Zosyn 3.375 g IV q8h   Eddie CandleAbbott, Toriann Spadoni Vernon 03/10/2015,7:41 AM

## 2015-03-10 NOTE — ED Notes (Signed)
pts breathing is much better.  She s a little more relaxed now

## 2015-03-10 NOTE — ED Notes (Signed)
The pt is finally getting a little cool after iv fluid and antibiotics.   Temp checked and supp given.  Vitals are down and she is more relaxed trying to go to sleep

## 2015-03-10 NOTE — ED Provider Notes (Addendum)
By signing my name below, I, Diamond Glass, attest that this documentation has been prepared under the direction and in the presence of Diamond Nunziata N Pacer Dorn, DO. Electronically Signed: Doreatha Glass, ED Scribe. 03/10/2015. 3:49 AM.   TIME SEEN: 3:43 AM   CHIEF COMPLAINT:  Chief Complaint  Patient presents with  . Shortness of Breath    LEVEL 5 CAVEAT: HPI and ROS limited due to respiratory distress    HPI:  HPI Comments: Diamond Glass is a 64 y.o. female BIBA with h/o COPD, HTN who presents to the Emergency Department due to respiratory distress onset 30 minutes PTA. Per EMS pt had SOB and wheezing today and showed no improvement with home nebulizer treatment. Pt had respirations of 22, O2 sat of 86% on 4L O2 and diminished lung sounds en route. She showed slight improvement with cpap, albuterol, atrovent, mag and solumedrol. She is O2 dependent on 4L [place] at home. Pt is an occasional smoker. No h/o heart failure. H/o intubation for COPD. She denies fever but has had a productive cough. Family was concerned that she could have pneumonia.  [facility]P [name] at [facility]  PAST MEDICAL HISTORY/PAST SURGICAL HISTORY:  Past Medical History  Diagnosis Date  . COPD (chronic obstructive pulmonary disease)   . Hypertension     MEDICATIONS:  Prior to Admission medications   Medication Sig Start Date End Date Taking? Authorizing Provider  albuterol (PROVENTIL HFA;VENTOLIN HFA) 108 (90 BASE) MCG/ACT inhaler Inhale 1-2 puffs into the lungs every 6 (six) hours as needed for wheezing or shortness of breath.    Historical Provider, MD  albuterol (PROVENTIL) (5 MG/ML) 0.5% nebulizer solution Take 2.5 mg by nebulization 3 (three) times daily as needed. For shortness of breath.     Historical Provider, MD  ALPRAZolam [name]) 0.5 MG tablet Take 0.5 mg by mouth 3 (three) times daily as needed. For anxiety.     Historical Provider, MD  budesonide-formoterol (SYMBICORT) 160-4.5 MCG/ACT inhaler Inhale 2  puffs into the lungs 2 (two) times daily.      Historical Provider, MD  FLUoxetine (PROZAC) 40 MG capsule Take 80 mg by mouth daily.    Historical Provider, MD  levalbuterol [facility] HFA) 45 MCG/ACT inhaler Inhale 2 puffs into the lungs every 6 (six) hours as needed for wheezing.    Historical Provider, MD  lisinopril (PRINIVIL,ZESTRIL) 10 MG tablet Take 10 mg by mouth daily.    Historical Provider, MD  nebivolol (BYSTOLIC) 5 MG tablet Take 5 mg by mouth every evening.      Historical Provider, MD  predniSONE (DELTASONE) 20 MG tablet Take 1 tablet (20 mg total) by mouth daily with breakfast. 40 mg (2 tabs)qd 4 days, 20 mg (1 tab)qd next 4 days, 10 mg (half a tab)qd 4 days. 12/01/14   Diamond Mclean, MD  Pseudoeph-Doxylamine-DM-APAP (NYQUIL PO) Take 2 tablets by mouth daily as needed. For flu and cold symptoms    Historical Provider, MD  Pseudoephedrine-APAP-DM (DAYQUIL PO) Take 2 tablets by mouth daily as needed. For flu and cold symptoms    Historical Provider, MD  tiotropium (SPIRIVA) 18 MCG inhalation capsule Place 18 mcg into inhaler and inhale daily.      Historical Provider, MD    ALLERGIES:  Allergies  Allergen Reactions  . Codeine     REACTION: vomit  . Morphine And Related Other (See Comments)    "makes me crazy"    SOCIAL HISTORY:  Social History  Substance Use Topics  .  Smoking status: Former Smoker -- 4.00 packs/day for 50 years    Types: Cigarettes    Quit date: 04/09/2008  . Smokeless tobacco: Not on file  . Alcohol Use: No    FAMILY HISTORY: No family history on file.  EXAM: BP 154/84 mmHg  Pulse 124  Temp(Src) 102.9 F (39.4 C) (Rectal)  Resp 30  Ht  (1.575 m)  Wt 125 lb (56.7 kg)  BMI 22.86 kg/m2  SpO2 100% CONSTITUTIONAL: Alert and oriented and responds appropriately to questions. Chronically ill-appearing, in respiratory distress HEAD: Normocephalic; atraumatic EYES: Conjunctivae clear, PERRL, EOMI ENT: normal nose; no rhinorrhea; moist mucous  membranes; pharynx without lesions noted; no dental injury; no septal hematoma NECK: Supple, no meningismus, no LAD; no midline spinal tenderness, step-off or deformity CARD: Regular rate, tachycardic; S1 and S2 appreciated; no murmurs, no clicks, no rubs, no gallops RESP:Tachypneic, severe respiratory distress. Significantly diminished breath sounds diffusely; on BIPAP CHEST:  chest wall stable, no crepitus or ecchymosis or deformity, nontender to palpation ABD/GI: Normal bowel sounds; non-distended; soft, non-tender, no rebound, no guarding PELVIS:  stable, nontender to palpation BACK:  The back appears normal and is non-tender to palpation, there is no CVA tenderness; no midline spinal tenderness, step-off or deformity EXT: Normal ROM in all joints; non-tender to palpation; no edema; normal capillary refill; no cyanosis, no bony tenderness or bony deformity of patient's extremities, no joint effusion, no ecchymosis or lacerations    SKIN: Normal color for age and race; warm NEURO: Moves all extremities equally, sensation to light touch intact diffusely, cranial nerves II through XII intact PSYCH: The patient's mood and manner are appropriate. Grooming and personal hygiene are appropriate.  MEDICAL DECISION MAKING: Patient here with respiratory distress. On CPap with EMS and transition to BiPAP in the ED. We'll obtain labs, chest x-ray. She is very hypertensive and tachycardic. Concern for possible flash pulmonary edema versus COPD exacerbation. We'll give IV labetalol.  ED PROGRESS: Patient's chest x-ray shows a right base infiltrate on severe COPD likely pneumonia. No edema. Her blood pressure and heart rate are improving while on BiPAP. We'll cancel the IV labetalol. Rectal temp is 102.9. Suspect this is pneumonia, sepsis. ABG shows a pH of 7.335, PCO2 63.9, PO2 149, bicarbonate 34.1. Will continue BiPAP. We'll give broad-spectrum antibiotics for sepsis, IV fluids.  5:30 AM  D/w Dr. [name]  for admission to stepdown. Patient's lactate is normal. She has a leukocytosis of 33,000 with left shift. Patient is improving clinically.  6:45 AM  Pt's second blood gas is slightly worse than the first time ever she is significantly improved clinically. I do not feel she needs to be intubated at this time. She is able to talk, he is oriented 3, awake, alert. We'll continue BiPAP. Hospitalist aware. Vital signs are improving.  CRITICAL CARE Performed by: Layla Maw Lashay Osborne, DO  Total critical care time: 45 minutes Critical care time was exclusive of separately billable procedures and treating other patients. Critical care was necessary to treat or prevent imminent or life-threatening deterioration. Critical care was time spent personally by me on the following activities: development of treatment plan with patient and/or surrogate as well as nursing, discussions with consultants, evaluation of patient's response to treatment, examination of patient, obtaining history from patient or surrogate, ordering and performing treatments and interventions, ordering and review of laboratory studies, ordering and review of radiographic studies, pulse oximetry and re-evaluation of patient's condition.     EKG Interpretation  Date/Time:  Thursday March 10 2015 03:47:33 EST Ventricular Rate:  116 PR Interval:  131 QRS Duration: 96 QT Interval:  354 QTC Calculation: 492 R Axis:   87 Text Interpretation:  Sinus tachycardia Biatrial enlargement Borderline right axis deviation Left ventricular hypertrophy ST elevation, consider anterior injury Borderline prolonged QT interval Confirmed by Adleigh Mcmasters,  DO, Minh Jasper ([id number]) on 03/10/2015 4:24:15 AM         I personally performed the services described in this documentation, which was scribed in my presence. The recorded information has been reviewed and is accurate.   Layla MawKristen N Taji Barretto, DO 03/10/15 [id number]  Layla MawKristen N Lyric Rossano, DO 03/10/15 [id number]

## 2015-03-10 NOTE — ED Notes (Signed)
Family at the bedside.  She apprehensive and so is the pt  The family member is not helping.

## 2015-03-10 NOTE — Plan of Care (Signed)
Called for admission by Dr. Elesa MassedWard. 64 year old female with history of COPD presents with acute shortness of breath and fever and chills. Chest x-ray shows infiltrates. Patient has significant leukocytosis. Patient was placed on BiPAP for COPD exacerbation and treated for sepsis from pneumonia. As per the ER physician patient's respiratory status has significantly improved.  Diamond Glass.

## 2015-03-10 NOTE — ED Notes (Signed)
The pt arrived by gems with sob   For 45 minutes.  Wheezing for 30 minutes.  At home sats were 86 %.  Diminished breath sounds rt lung upper lt lung wheezing.  She arrived on c-pap  She had 2 ivs one in her lt forearm infiuktrated rt forearm remains.  She was given albuterol 1.5 enroute magsulfate 2 gms given by ems  Solu-medrol 125mg  iv  She uses home 02

## 2015-03-11 ENCOUNTER — Inpatient Hospital Stay (HOSPITAL_COMMUNITY): Payer: Medicare Other

## 2015-03-11 LAB — URINE CULTURE: CULTURE: NO GROWTH

## 2015-03-11 LAB — COMPREHENSIVE METABOLIC PANEL
ALT: 14 U/L (ref 14–54)
ANION GAP: 7 (ref 5–15)
AST: 17 U/L (ref 15–41)
Albumin: 2.7 g/dL — ABNORMAL LOW (ref 3.5–5.0)
Alkaline Phosphatase: 46 U/L (ref 38–126)
BILIRUBIN TOTAL: 0.5 mg/dL (ref 0.3–1.2)
BUN: 23 mg/dL — AB (ref 6–20)
CO2: 29 mmol/L (ref 22–32)
Calcium: 9.1 mg/dL (ref 8.9–10.3)
Chloride: 104 mmol/L (ref 101–111)
Creatinine, Ser: 0.72 mg/dL (ref 0.44–1.00)
Glucose, Bld: 114 mg/dL — ABNORMAL HIGH (ref 65–99)
POTASSIUM: 5.1 mmol/L (ref 3.5–5.1)
Sodium: 140 mmol/L (ref 135–145)
TOTAL PROTEIN: 5.1 g/dL — AB (ref 6.5–8.1)

## 2015-03-11 LAB — GLUCOSE, CAPILLARY
GLUCOSE-CAPILLARY: 83 mg/dL (ref 65–99)
GLUCOSE-CAPILLARY: 131 mg/dL — AB (ref 65–99)
GLUCOSE-CAPILLARY: 139 mg/dL — AB (ref 65–99)
Glucose-Capillary: 96 mg/dL (ref 65–99)

## 2015-03-11 LAB — CBC
HEMATOCRIT: 29.1 % — AB (ref 36.0–46.0)
Hemoglobin: 9.5 g/dL — ABNORMAL LOW (ref 12.0–15.0)
MCH: 30.3 pg (ref 26.0–34.0)
MCHC: 32.6 g/dL (ref 30.0–36.0)
MCV: 92.7 fL (ref 78.0–100.0)
Platelets: 269 10*3/uL (ref 150–400)
RBC: 3.14 MIL/uL — AB (ref 3.87–5.11)
RDW: 12.8 % (ref 11.5–15.5)
WBC: 23.8 10*3/uL — AB (ref 4.0–10.5)

## 2015-03-11 LAB — HEMOGLOBIN A1C
HEMOGLOBIN A1C: 5.3 % (ref 4.8–5.6)
Mean Plasma Glucose: 105 mg/dL

## 2015-03-11 LAB — LEGIONELLA PNEUMOPHILA SEROGP 1 UR AG: L. PNEUMOPHILA SEROGP 1 UR AG: NEGATIVE

## 2015-03-11 MED ORDER — ACETAMINOPHEN 325 MG PO TABS
650.0000 mg | ORAL_TABLET | Freq: Four times a day (QID) | ORAL | Status: DC | PRN
  Administered 2015-03-11 – 2015-03-14 (×8): 650 mg via ORAL
  Filled 2015-03-11 (×8): qty 2

## 2015-03-11 MED ORDER — BUDESONIDE 0.25 MG/2ML IN SUSP
0.2500 mg | Freq: Two times a day (BID) | RESPIRATORY_TRACT | Status: DC
  Administered 2015-03-11 – 2015-03-14 (×6): 0.25 mg via RESPIRATORY_TRACT
  Filled 2015-03-11 (×6): qty 2

## 2015-03-11 NOTE — Consult Note (Signed)
[facility]  Consult closed.  CH will follow up.

## 2015-03-11 NOTE — Progress Notes (Signed)
[facility] PROGRESS NOTE  [facility] ZOX:[id number] DOB: 10/21/1950 DOA: 03/10/2015 PCP: [name], PA-C  Assessment/Plan: #1. Acute on chronic respiratory failure related to COPD exacerbation in the setting of pneumonia per chest x-ray -Improving  -Continue nebulizer, steroids and antibiotics  -will follow clinical response  -Chart review indicates in 2010 FEV1 38%  #2. Sepsis related to pneumonia -Will continue antibiotics per protocol -Tylenol for fever PRN -Blood cultures pending -On admission lactic acid and pro-calcitonin within the limits of normal.  -Hemodynamically stable   #3. Pneumonia. Per chest x-ray -Antibiotics per protocol -Blood cultures w/o growth to date -Sputum culture pending  -Strep pneumo and Legionella urine antigens pending  #4. COPD exacerbation likely triggered by a #2 -Chart review indicates cold stage III, severe COPD -She is on 4 L nasal cannula at home -continue steroids, nebulizer and antibiotics -will add pulmicort and flutter valve  #5. Hypokalemia. Mild -Replete as needed   #6. Hyperglycemia related to steroids -Patient on chronic steroids low-dose at home -Providing Solu-Medrol as noted above -Will monitor CBG and use sliding scale insulin as indicated  #7. Hypertension. -stable -will continue lisinopril -PRN hydralazine -will monitor   #8. Prolonged QT. Mild per EKG -Current EKG similar to past -No chest pain -troponin neg  #9. Anemia. Normocytic. Likely related to chronic disease -Current hemoglobin at baseline -Monitor Hgb trend -No signs symptoms of active bleeding  #10. Anxiety/depression. Chronic -Stable and at baseline -Will continue home benzos -Contributes to SOB and tachypnea -Continue Prozac as well  #11. Tobacco use.  -cessation counseling provided -patient declines nicoderm  Code Status: Partial  Family Communication: no family at bedside  Disposition Plan: remains inpatient, continue  IV antibiotics, nebulizer treatment and steroids.   Consultants:  None   Procedures:  See below for x-ray reports   Antibiotics:  Vancomycin and zosyn 12/1  HPI/Subjective: No CP, no fever this morning; patient with improve in her breathing, but still having trouble speaking in full sentences and with wheezing.  Objective: Filed Vitals:   03/11/15 1319 03/11/15 1518  BP:  152/82  Pulse: 91 96  Temp:    Resp: 18 24    Intake/Output Summary (Last 24 hours) at 03/11/15 1636 Last data filed at 03/11/15 1300  Gross per 24 hour  Intake 2663.75 ml  Output    175 ml  Net 2488.75 ml   Filed Weights   03/10/15 0354  Weight: 56.7 kg (125 lb)    Exam:   General:  Afebrile now; no CP and reports breathing is better. Still with wheezing and unable to speak in full sentences.   Cardiovascular:sinus rhythm, no rubs and no gallops  Respiratory: fair air movement, positive exp wheezing and rhonchi  Abdomen: soft, NT, ND, positive BS  Musculoskeletal: no swelling, no cyanosis   Data Reviewed: Basic Metabolic Panel:  Recent Labs Lab 03/10/15 0427 03/10/15 1849 03/11/15 0645  NA 137  --  140  K 3.4*  --  5.1  CL 97*  --  104  CO2 31  --  29  GLUCOSE 187*  --  114*  BUN 23*  --  23*  CREATININE 0.95  --  0.72  CALCIUM 9.2  --  9.1  MG  --  2.0  --    Liver Function Tests:  Recent Labs Lab 03/11/15 0645  AST 17  ALT 14  ALKPHOS 46  BILITOT 0.5  PROT 5.1*  ALBUMIN 2.7*   CBC:  Recent Labs Lab 03/10/15 0427 03/11/15  0645  WBC 33.1* 23.8*  NEUTROABS 30.9*  --   HGB 11.7* 9.5*  HCT 36.3 29.1*  MCV 94.3 92.7  PLT 377 269   Cardiac Enzymes:  Recent Labs Lab 03/10/15 1849  TROPONINI 0.17*   BNP (last 3 results)  Recent Labs  04/09/14 1959 03/10/15 0427  BNP 120.6* 178.7*   CBG:  Recent Labs Lab 03/10/15 1222 03/10/15 1747 03/10/15 2121 03/11/15 0755 03/11/15 1245  GLUCAP 125* 140* 109* 96 83    Recent Results (from the past  240 hour(s))  Blood culture (routine x 2)     Status: None (Preliminary result)   Collection Time: 03/10/15  5:05 AM  Result Value Ref Range Status   Specimen Description BLOOD LEFT ANTECUBITAL  Final   Special Requests BOTTLES DRAWN AEROBIC AND ANAEROBIC 5CC  Final   Culture NO GROWTH 1 DAY  Final   Report Status PENDING  Incomplete  Blood culture (routine x 2)     Status: None (Preliminary result)   Collection Time: 03/10/15  5:11 AM  Result Value Ref Range Status   Specimen Description BLOOD HAND LEFT  Final   Special Requests BOTTLES DRAWN AEROBIC AND ANAEROBIC 5CC  Final   Culture NO GROWTH 1 DAY  Final   Report Status PENDING  Incomplete  Urine culture     Status: None   Collection Time: 03/10/15  1:34 PM  Result Value Ref Range Status   Specimen Description URINE, CLEAN CATCH  Final   Special Requests NONE  Final   Culture NO GROWTH 1 DAY  Final   Report Status 03/11/2015 FINAL  Final     Studies: Dg Chest Port 1 View  03/11/2015  CLINICAL DATA:  COPD. EXAM: PORTABLE CHEST 1 VIEW COMPARISON:  03/10/2015. FINDINGS: Mediastinum hilar structures normal. Cardiomegaly with pulmonary vascular prominence and interstitial prominence with bilateral effusions. Findings are worse of from prior exam. Findings consistent with congestive heart failure. Right base infiltrate is improved. No pneumothorax. IMPRESSION: 1. Cardiomegaly with progressive pulmonary venous congestion, bilateral pulmonary interstitial prominence, and small pleural effusions consistent with congestive heart failure. 2.  Interim partial clearing of right base infiltrate. Electronically Signed   By: Maisie Fus  Register   On: 03/11/2015 07:52   Dg Chest Port 1 View  03/10/2015  CLINICAL DATA:  Dyspnea, onset tonight. EXAM: PORTABLE CHEST 1 VIEW COMPARISON:  11/30/2014 FINDINGS: There is patchy airspace opacity in the lateral right base, superimposed on marked hyperinflation and emphysematous changes. Fibrotic appearing  interstitial coarsening is present. No large effusions. No pneumothorax. IMPRESSION: Right base infiltrate superimposed on severe COPD, likely pneumonia. Electronically Signed   By: Ellery Plunk M.D.   On: 03/10/2015 04:05    Scheduled Meds: . budesonide (PULMICORT) nebulizer solution  0.25 mg Nebulization BID  . enoxaparin (LOVENOX) injection  30 mg Subcutaneous Daily  . FLUoxetine  80 mg Oral Daily  . insulin aspart  0-20 Units Subcutaneous TID WC  . insulin aspart  0-5 Units Subcutaneous QHS  . ipratropium-albuterol  3 mL Nebulization Q4H  . lisinopril  10 mg Oral Daily  . meloxicam  15 mg Oral Daily  . methylPREDNISolone (SOLU-MEDROL) injection  60 mg Intravenous Q12H  . piperacillin-tazobactam (ZOSYN)  IV  3.375 g Intravenous 3 times per day  . vancomycin  500 mg Intravenous Q12H   Continuous Infusions: . sodium chloride 125 mL/hr at 03/10/15 2311    Principal Problem:   Acute on chronic respiratory failure [facility]) Active Problems:   Essential hypertension  COPD exacerbation (HCC)   Community acquired pneumonia   Chronic anemia   Anxiety and depression   History of tobacco abuse   Prolonged QT interval   Sepsis (HCC)   Hypokalemia   Hyperglycemia   Pneumonia    Time spent: 35 minutes    Vassie LollMadera, Americo Vallery  [facility] Pager [phone]. If 7PM-7AM, please contact night-coverage at [email or web address], password [email or web address] 03/11/2015, 4:36 PM  LOS: 1 day

## 2015-03-11 NOTE — Evaluation (Signed)
Physical Therapy Evaluation Patient Details Name: Diamond Glass MRN: [id number] DOB: 1951/04/05 Today's Date: 03/11/2015   History of Present Illness  Patient is a 64 y/o female with hx of COPD, HTN, anxiety who presents with SOB and found to have acute on chronic respiratory failure and sepsis related to pneumonia and COPD exacerbation. CXR-bilateral pulmonary interstitial prominence, and small pleural effusions consistent with CHF.  Clinical Impression  Patient presents with dyspnea on exertion, decreased activity tolerance and weakness secondary to above impacting safe mobility. Tolerated short distance ambulation with long standing rest break due to SOB and fatigue. Sp02 dropped to 83% on 4L/min 02. Pt has 24/7 S from daughter at home. Reviewed breathing techniques. Will follow acutely to improve endurance and maximize independence prior to return home.    Follow Up Recommendations No PT follow up;Supervision - Intermittent    Equipment Recommendations  None recommended by PT    Recommendations for Other Services OT consult     Precautions / Restrictions Precautions Precautions: Fall Precaution Comments: monitor 02 Restrictions Weight Bearing Restrictions: No      Mobility  Bed Mobility               General bed mobility comments: Sitting in chair upon PT arrival.   Transfers Overall transfer level: Needs assistance Equipment used: None Transfers: Sit to/from Stand Sit to Stand: Supervision         General transfer comment: Supervision for safety. Pt unsteady initially upon standing and reaching for furniture for support.   Ambulation/Gait Ambulation/Gait assistance: Min guard Ambulation Distance (Feet): 75 Feet Assistive device: Rolling walker (2 wheeled);None Gait Pattern/deviations: Step-through pattern;Decreased stride length;Trunk flexed   Gait velocity interpretation: <1.8 ft/sec, indicative of risk for recurrent falls General Gait Details: Slow,  steady gait with trembling noted throughout body. Sp02 dropped to 83% on 4L/min 02. Long standing rest break with pt leaning on walker handles. CUes for pursed lip breathing. Pt mouth breather at baseline.  Stairs            Wheelchair Mobility    Modified Rankin (Stroke Patients Only)       Balance Overall balance assessment: Needs assistance Sitting-balance support: Feet supported;No upper extremity supported Sitting balance-Leahy Scale: Good     Standing balance support: During functional activity Standing balance-Leahy Scale: Fair Standing balance comment: Does furniture walking within room requiring UE support.                              Pertinent Vitals/Pain Pain Assessment: No/denies pain    Home Living Family/patient expects to be discharged to:: Private residence Living Arrangements: Children Available Help at Discharge: Family;Available 24 hours/day (daughter does not work and takes care of her) Type of Home: House Home Access: Stairs to enter Entrance Stairs-Rails: Right Entrance Stairs-Number of Steps: 2 Home Layout: One level Home Equipment: Bedside commode;Cane - single point;Walker - 4 wheels      Prior Function Level of Independence: Independent with assistive device(s)         Comments: Pt uses rollator for ambulation. Reports multiple falls. Wears 4L 02 at home. Daughter assists with IADLs.      Hand Dominance        Extremity/Trunk Assessment   Upper Extremity Assessment: Defer to OT evaluation           Lower Extremity Assessment: Generalized weakness         Communication   Communication: No difficulties  Cognition Arousal/Alertness: Awake/alert Behavior During Therapy: WFL for tasks assessed/performed Overall Cognitive Status: Within Functional Limits for tasks assessed                      General Comments General comments (skin integrity, edema, etc.): Discussed placing [place] in mouth as pt is a  mouth breather in order to be able to utilize oxygen efficiently.    Exercises        Assessment/Plan    PT Assessment Patient needs continued PT services  PT Diagnosis Difficulty walking;Generalized weakness   PT Problem List Decreased strength;Cardiopulmonary status limiting activity;Decreased activity tolerance;Decreased mobility;Decreased balance  PT Treatment Interventions Balance training;Gait training;Functional mobility training;Therapeutic activities;Therapeutic exercise;Patient/family education;Stair training   PT Goals (Current goals can be found in the Care Plan section) Acute Rehab PT Goals Patient Stated Goal: to be able to breathe PT Goal Formulation: With patient Time For Goal Achievement: 03/25/15 Potential to Achieve Goals: Fair    Frequency Min 3X/week   Barriers to discharge        Co-evaluation               End of Session Equipment Utilized During Treatment: Gait belt;Oxygen Activity Tolerance: Patient tolerated treatment well;Treatment limited secondary to medical complications (Comment) (drop in Sp02) Patient left: in chair;with call bell/phone within reach;with chair alarm set Nurse Communication: Mobility status         Time: 0981-1914 PT Time Calculation (min) (ACUTE ONLY): 17 min   Charges:   PT Evaluation $Initial PT Evaluation Tier I: 1 Procedure     PT G Codes:        Emir Nack A Sharel Behne 03/11/2015, 11:33 AM Mylo Red, PT, DPT [phone]

## 2015-03-11 NOTE — Care Management Note (Signed)
Case Management Note  Patient Details  Name: Rush Farmernnas N Lacap MRN: [id number] Date of Birth: Oct 11, 1950  Subjective/Objective:                 Patient admitted with PNA, sepsis, acute on chronic respiratory failure. Patient is 4L oxygen dependent at home. Patient will continue IV Abx.   Action/Plan:  [facility]  Expected Discharge Date:                  Expected Discharge Plan:  Home/Self Care  In-House Referral:     Discharge planning Services  CM Consult  Post Acute Care Choice:    Choice offered to:     DME Arranged:    DME Agency:     HH Arranged:    HH Agency:     Status of Service:  In process, will continue to follow  Medicare Important Message Given:    Date Medicare IM Given:    Medicare IM give by:    Date Additional Medicare IM Given:    Additional Medicare Important Message give by:     If discussed at Long Length of Stay Meetings, dates discussed:    Additional Comments:  Lawerance SabalDebbie Gelsey Amyx, RN 03/11/2015, 3:22 PM

## 2015-03-11 NOTE — Progress Notes (Signed)
RT instructed pt on proper use of fiutter valve. Pt was able to demonstrate back good technique. RT will continue to monitor.

## 2015-03-12 ENCOUNTER — Inpatient Hospital Stay (HOSPITAL_COMMUNITY): Payer: Medicare Other

## 2015-03-12 ENCOUNTER — Encounter (HOSPITAL_COMMUNITY): Payer: Self-pay | Admitting: *Deleted

## 2015-03-12 LAB — GLUCOSE, CAPILLARY
GLUCOSE-CAPILLARY: 107 mg/dL — AB (ref 65–99)
GLUCOSE-CAPILLARY: 103 mg/dL — AB (ref 65–99)
GLUCOSE-CAPILLARY: 109 mg/dL — AB (ref 65–99)
GLUCOSE-CAPILLARY: 116 mg/dL — AB (ref 65–99)

## 2015-03-12 LAB — BASIC METABOLIC PANEL
Anion gap: 9 (ref 5–15)
BUN: 33 mg/dL — AB (ref 6–20)
CHLORIDE: 103 mmol/L (ref 101–111)
CO2: 25 mmol/L (ref 22–32)
Calcium: 9.7 mg/dL (ref 8.9–10.3)
Creatinine, Ser: 0.81 mg/dL (ref 0.44–1.00)
GFR calc non Af Amer: >60 mL/min (ref >60)
Glucose, Bld: 137 mg/dL — ABNORMAL HIGH (ref 65–99)
POTASSIUM: 5.2 mmol/L — AB (ref 3.5–5.1)
SODIUM: 137 mmol/L (ref 135–145)

## 2015-03-12 LAB — CBC
HEMATOCRIT: 35.7 % — AB (ref 36.0–46.0)
HEMOGLOBIN: 11 g/dL — AB (ref 12.0–15.0)
MCH: 29.1 pg (ref 26.0–34.0)
MCHC: 30.8 g/dL (ref 30.0–36.0)
MCV: 94.4 fL (ref 78.0–100.0)
Platelets: 452 10*3/uL — ABNORMAL HIGH (ref 150–400)
RBC: 3.78 MIL/uL — AB (ref 3.87–5.11)
RDW: 13 % (ref 11.5–15.5)
WBC: 31.7 10*3/uL — ABNORMAL HIGH (ref 4.0–10.5)

## 2015-03-12 MED ORDER — SODIUM CHLORIDE 0.9 % IV SOLN
INTRAVENOUS | Status: DC
  Administered 2015-03-12: 18:00:00 via INTRAVENOUS

## 2015-03-12 MED ORDER — LORAZEPAM 2 MG/ML IJ SOLN
0.5000 mg | Freq: Once | INTRAMUSCULAR | Status: AC
  Administered 2015-03-12: 0.5 mg via INTRAVENOUS
  Filled 2015-03-12: qty 1

## 2015-03-12 MED ORDER — IOHEXOL 350 MG/ML SOLN
80.0000 mL | Freq: Once | INTRAVENOUS | Status: AC | PRN
  Administered 2015-03-12: 80 mL via INTRAVENOUS

## 2015-03-12 MED ORDER — LORAZEPAM 2 MG/ML IJ SOLN: 1.0000 mg | Freq: Once | INTRAMUSCULAR | Status: DC

## 2015-03-12 MED ORDER — CETYLPYRIDINIUM CHLORIDE 0.05 % MT LIQD
7.0000 mL | Freq: Two times a day (BID) | OROMUCOSAL | Status: DC
  Administered 2015-03-12 – 2015-03-15 (×7): 7 mL via OROMUCOSAL

## 2015-03-12 NOTE — Progress Notes (Signed)
   03/11/15 1600  Clinical Encounter Type  Visited With Patient  Visit Type Spiritual support  Referral From Nurse  Spiritual Encounters  Spiritual Needs Prayer;Emotional  Stress Factors  Patient Stress Factors Family relationships;Health changes;Major life changes  Patient expressed concern with husband's lack of acknowledgement of seriousness of illness. Also discussed issues with daughter and grandson. Needed prayer and chaplain provided that. Expressed desire to see chaplain again.

## 2015-03-12 NOTE — Plan of Care (Signed)
Problem: Phase II Progression Outcomes Goal: Dyspnea controlled w/progressive activity Outcome: Not Progressing Pt continues to have SOB  Upon exertion

## 2015-03-12 NOTE — Progress Notes (Signed)
CT called this RN for patient to have CT angio of chest. Pt needs anti-anxiety for test. Patient received Xanax 0.5mg  at 2126. Notified Claiborne Billingsallahan, NP of pt receiving Xanax and needing Ativan IV for test due to history of panic earlier today with this test. Claiborne Billingsallahan modified current Ativan order to 0.5mg  instead of 1mg . This RN will give Ativan 0.5mg  IV and call CT for test. Will continue to monitor.

## 2015-03-12 NOTE — Progress Notes (Signed)
Pt is very anxious this morning, BP  increased to 234/16012mm of hg. Pt is sitting at th edge of  the bed. Lung sounds remains unchanged from previous assessment.pt also c/o headache. Pt is medicated with prn tylenol, xanax, and hydralazine. Rechecked bp is 157/68 . Pt reports feeling better. Will cont to monitor.

## 2015-03-12 NOTE — Progress Notes (Addendum)
[facility] PROGRESS NOTE  Genie Wenke [facility] JYN:[id number] DOB: 1950-11-10 DOA: 03/10/2015 PCP: [name], PA-C  Assessment/Plan: #1. Acute on chronic respiratory failure related to COPD exacerbation in the setting of pneumonia per chest x-ray -Improving  -Continue nebulizer, steroids and antibiotics  -will follow clinical response  -Chart review indicates in 2010 FEV1 38%   #2. Sepsis related to pneumonia -Will continue antibiotics per protocol -Tylenol for fever PRN -Blood cultures NGSF -On admission lactic acid and pro-calcitonin within the limits of normal.  -Hemodynamically stable    #3. Pneumonia. Per chest x-ray -Antibiotics per protocol -Blood cultures w/o growth to date -Sputum culture pending  -Strep pneumo and Legionella urine antigens pending   #4. COPD exacerbation likely triggered by a #2 -Chart review indicates cold stage III, severe COPD -She is on 4 L nasal cannula at home -continue steroids, nebulizer and antibiotics -will add pulmicort and flutter valve  #5. Hypokalemia. Mild -Replete as needed   #6. Hyperglycemia related to steroids -Patient on chronic steroids low-dose at home -Providing Solu-Medrol as noted above -Will monitor CBG and use sliding scale insulin as indicated  #7. Hypertension. -stable -will continue lisinopril -PRN hydralazine -will monitor   #8. Prolonged QT. Mild per EKG -Current EKG similar to past -No chest pain -troponin neg   #9. Anemia. Normocytic. Likely related to chronic disease -Current hemoglobin at baseline -Monitor Hgb trend -No signs symptoms of active bleeding   #10. Anxiety/depression. Chronic -Stable and at baseline -Will continue home benzos -Contributes to SOB and tachypnea -Continue Prozac as well   #11. Tobacco use.  -cessation counseling provided -patient declines nicoderm  12. leukocytosis , likely due to steroids , ordered CT chest to r/o empyema , could not lay flat due to  anxiety, need premedication with ativan and reattempt  Add a differential to wbc ,    Code Status: Partial  Family Communication: no family at bedside  Disposition Plan: remains inpatient, continue IV antibiotics, nebulizer treatment and steroids.   Consultants:  None   Procedures:  See below for x-ray reports   Antibiotics:  Vancomycin and zosyn 12/1  HPI/Subjective: Patient denies chest pain, reports feeling very anxious last night and wants Xanax to be restarted  Objective: Filed Vitals:   03/12/15 0524 03/12/15 0642  BP: 234/112 157/68  Pulse: 102 104  Temp: 98.8 F (37.1 C)   Resp: 24 21    Intake/Output Summary (Last 24 hours) at 03/12/15 1022 Last data filed at 03/12/15 0957  Gross per 24 hour  Intake 1898.75 ml  Output    910 ml  Net 988.75 ml   Filed Weights   03/10/15 0354  Weight: 56.7 kg (125 lb)    Exam:   General:  Afebrile now; no CP and reports breathing is better. Still with wheezing and unable to speak in full sentences.   Cardiovascular:sinus rhythm, no rubs and no gallops  Respiratory: fair air movement, positive exp wheezing and rhonchi  Abdomen: soft, NT, ND, positive BS  Musculoskeletal: no swelling, no cyanosis   Data Reviewed: Basic Metabolic Panel:  Recent Labs Lab 03/10/15 0427 03/10/15 1849 03/11/15 0645 03/12/15 0558  NA 137  --  140 137  K 3.4*  --  5.1 5.2*  CL 97*  --  104 103  CO2 31  --  29 25  GLUCOSE 187*  --  114* 137*  BUN 23*  --  23* 33*  CREATININE 0.95  --  0.72 0.81  CALCIUM 9.2  --  9.1 9.7  MG  --  2.0  --   --    Liver Function Tests:  Recent Labs Lab 03/11/15 0645  AST 17  ALT 14  ALKPHOS 46  BILITOT 0.5  PROT 5.1*  ALBUMIN 2.7*   CBC:  Recent Labs Lab 03/10/15 0427 03/11/15 0645 03/12/15 0558  WBC 33.1* 23.8* 31.7*  NEUTROABS 30.9*  --   --   HGB 11.7* 9.5* 11.0*  HCT 36.3 29.1* 35.7*  MCV 94.3 92.7 94.4  PLT 377 269 452*   Cardiac Enzymes:  Recent Labs Lab  03/10/15 1849  TROPONINI 0.17*   BNP (last 3 results)  Recent Labs  04/09/14 1959 03/10/15 0427  BNP 120.6* 178.7*   CBG:  Recent Labs Lab 03/11/15 0755 03/11/15 1245 03/11/15 1730 03/11/15 2102 03/12/15 0730  GLUCAP 96 83 131* 139* 107*    Recent Results (from the past 240 hour(s))  Blood culture (routine x 2)     Status: None (Preliminary result)   Collection Time: 03/10/15  5:05 AM  Result Value Ref Range Status   Specimen Description BLOOD LEFT ANTECUBITAL  Final   Special Requests BOTTLES DRAWN AEROBIC AND ANAEROBIC 5CC  Final   Culture NO GROWTH 1 DAY  Final   Report Status PENDING  Incomplete  Blood culture (routine x 2)     Status: None (Preliminary result)   Collection Time: 03/10/15  5:11 AM  Result Value Ref Range Status   Specimen Description BLOOD HAND LEFT  Final   Special Requests BOTTLES DRAWN AEROBIC AND ANAEROBIC 5CC  Final   Culture NO GROWTH 1 DAY  Final   Report Status PENDING  Incomplete  Urine culture     Status: None   Collection Time: 03/10/15  1:34 PM  Result Value Ref Range Status   Specimen Description URINE, CLEAN CATCH  Final   Special Requests NONE  Final   Culture NO GROWTH 1 DAY  Final   Report Status 03/11/2015 FINAL  Final     Studies: Dg Chest Port 1 View  03/11/2015  CLINICAL DATA:  COPD. EXAM: PORTABLE CHEST 1 VIEW COMPARISON:  03/10/2015. FINDINGS: Mediastinum hilar structures normal. Cardiomegaly with pulmonary vascular prominence and interstitial prominence with bilateral effusions. Findings are worse of from prior exam. Findings consistent with congestive heart failure. Right base infiltrate is improved. No pneumothorax. IMPRESSION: 1. Cardiomegaly with progressive pulmonary venous congestion, bilateral pulmonary interstitial prominence, and small pleural effusions consistent with congestive heart failure. 2.  Interim partial clearing of right base infiltrate. Electronically Signed   By: Maisie Fus  Register   On: 03/11/2015  07:52    Scheduled Meds: . budesonide (PULMICORT) nebulizer solution  0.25 mg Nebulization BID  . enoxaparin (LOVENOX) injection  30 mg Subcutaneous Daily  . FLUoxetine  80 mg Oral Daily  . insulin aspart  0-20 Units Subcutaneous TID WC  . insulin aspart  0-5 Units Subcutaneous QHS  . ipratropium-albuterol  3 mL Nebulization Q4H  . lisinopril  10 mg Oral Daily  . meloxicam  15 mg Oral Daily  . methylPREDNISolone (SOLU-MEDROL) injection  60 mg Intravenous Q12H  . piperacillin-tazobactam (ZOSYN)  IV  3.375 g Intravenous 3 times per day  . vancomycin  500 mg Intravenous Q12H   Continuous Infusions: . sodium chloride 75 mL/hr at 03/11/15 1744    Principal Problem:   Acute on chronic respiratory failure [facility]) Active Problems:   Essential hypertension   COPD exacerbation (HCC)   Community acquired pneumonia   Chronic anemia  Anxiety and depression   History of tobacco abuse   Prolonged QT interval   Sepsis (HCC)   Hypokalemia   Hyperglycemia   Pneumonia    Time spent: 35 minutes    [facility],Justine Dines  [facility] Pager [phone]. If 7PM-7AM, please contact night-coverage at [email or web address], password [email or web address] 03/12/2015, 10:22 AM  LOS: 2 days

## 2015-03-13 DIAGNOSIS — I10 Essential (primary) hypertension: Secondary | ICD-10-CM

## 2015-03-13 DIAGNOSIS — Z87891 Personal history of nicotine dependence: Secondary | ICD-10-CM

## 2015-03-13 DIAGNOSIS — E876 Hypokalemia: Secondary | ICD-10-CM

## 2015-03-13 DIAGNOSIS — J9621 Acute and chronic respiratory failure with hypoxia: Secondary | ICD-10-CM

## 2015-03-13 DIAGNOSIS — R739 Hyperglycemia, unspecified: Secondary | ICD-10-CM

## 2015-03-13 LAB — COMPREHENSIVE METABOLIC PANEL
ALBUMIN: 3.1 g/dL — AB (ref 3.5–5.0)
ALK PHOS: 47 U/L (ref 38–126)
ALT: 20 U/L (ref 14–54)
ANION GAP: 7 (ref 5–15)
AST: 22 U/L (ref 15–41)
BILIRUBIN TOTAL: 0.5 mg/dL (ref 0.3–1.2)
BUN: 23 mg/dL — AB (ref 6–20)
CALCIUM: 9.4 mg/dL (ref 8.9–10.3)
CO2: 28 mmol/L (ref 22–32)
Chloride: 101 mmol/L (ref 101–111)
Creatinine, Ser: 0.75 mg/dL (ref 0.44–1.00)
GFR calc Af Amer: >60 mL/min (ref >60)
GFR calc non Af Amer: >60 mL/min (ref >60)
GLUCOSE: 118 mg/dL — AB (ref 65–99)
POTASSIUM: 4.8 mmol/L (ref 3.5–5.1)
SODIUM: 136 mmol/L (ref 135–145)
TOTAL PROTEIN: 5.9 g/dL — AB (ref 6.5–8.1)

## 2015-03-13 LAB — CBC WITH DIFFERENTIAL/PLATELET
BASOS ABS: 0 10*3/uL (ref 0.0–0.1)
BASOS PCT: 0 %
EOS ABS: 0 10*3/uL (ref 0.0–0.7)
Eosinophils Relative: 0 %
HEMATOCRIT: 32.8 % — AB (ref 36.0–46.0)
HEMOGLOBIN: 10 g/dL — AB (ref 12.0–15.0)
Lymphocytes Relative: 2 %
Lymphs Abs: 0.3 10*3/uL — ABNORMAL LOW (ref 0.7–4.0)
MCH: 28.7 pg (ref 26.0–34.0)
MCHC: 30.5 g/dL (ref 30.0–36.0)
MCV: 94.3 fL (ref 78.0–100.0)
Monocytes Absolute: 0.3 10*3/uL (ref 0.1–1.0)
Monocytes Relative: 1 %
NEUTROS ABS: 18.2 10*3/uL — AB (ref 1.7–7.7)
NEUTROS PCT: 97 %
Platelets: 396 10*3/uL (ref 150–400)
RBC: 3.48 MIL/uL — ABNORMAL LOW (ref 3.87–5.11)
RDW: 13.1 % (ref 11.5–15.5)
WBC: 18.8 10*3/uL — AB (ref 4.0–10.5)

## 2015-03-13 LAB — VANCOMYCIN, TROUGH: Vancomycin Tr: 10 ug/mL (ref 10.0–20.0)

## 2015-03-13 LAB — GLUCOSE, CAPILLARY
GLUCOSE-CAPILLARY: 129 mg/dL — AB (ref 65–99)
GLUCOSE-CAPILLARY: 105 mg/dL — AB (ref 65–99)
Glucose-Capillary: 129 mg/dL — ABNORMAL HIGH (ref 65–99)
Glucose-Capillary: 170 mg/dL — ABNORMAL HIGH (ref 65–99)

## 2015-03-13 MED ORDER — VANCOMYCIN HCL IN DEXTROSE 750-5 MG/150ML-% IV SOLN
750.0000 mg | Freq: Two times a day (BID) | INTRAVENOUS | Status: DC
  Administered 2015-03-13 – 2015-03-15 (×5): 750 mg via INTRAVENOUS
  Filled 2015-03-13 (×7): qty 150

## 2015-03-13 NOTE — Care Management Important Message (Signed)
Important Message  Patient Details  Name: Diamond Farmernnas N Saylor MRN: [id number] Date of Birth: [birth year]   Medicare Important Message Given:  Yes    Xeng Kucher Abena 03/13/2015, 11:07 AM

## 2015-03-13 NOTE — Progress Notes (Signed)
ANTIBIOTIC CONSULT NOTE - FOLLOW UP  Pharmacy Consult for Vanco/Zosyn Indication: PNA  Allergies  Allergen Reactions  . Codeine     REACTION: vomit  . Morphine And Related Other (See Comments)    "makes me crazy"    Patient Measurements: Height:  (157.5 cm) Weight: 125 lb (56.7 kg) IBW/kg (Calculated) : 50.1 Adjusted Body Weight:    Vital Signs: Temp: 98.4 F (36.9 C) (12/04 0511) Temp Source: Oral (12/04 0511) BP: 174/66 mmHg (12/04 0708) Pulse Rate: 86 (12/04 0708) Intake/Output from previous day: 12/03 0701 - 12/04 0700 In: 2425 [P.O.:340; I.V.:1735; IV Piggyback:350] Out: 1650 [Urine:1650] Intake/Output from this shift: Total I/O In: 0  Out: 150 [Urine:150]  Labs:  Recent Labs  03/11/15 0645 03/12/15 0558 03/13/15 0534  WBC 23.8* 31.7* 18.8*  HGB 9.5* 11.0* 10.0*  PLT 269 452* 396  CREATININE 0.72 0.81 0.75   Estimated Creatinine Clearance: 54.7 mL/min (by C-G formula based on Cr of 0.75).  Recent Labs  03/13/15 1023  VANCOTROUGH 10     Microbiology: Recent Results (from the past 720 hour(s))  Blood culture (routine x 2)     Status: None (Preliminary result)   Collection Time: 03/10/15  5:05 AM  Result Value Ref Range Status   Specimen Description BLOOD LEFT ANTECUBITAL  Final   Special Requests BOTTLES DRAWN AEROBIC AND ANAEROBIC 5CC  Final   Culture NO GROWTH 2 DAYS  Final   Report Status PENDING  Incomplete  Blood culture (routine x 2)     Status: None (Preliminary result)   Collection Time: 03/10/15  5:11 AM  Result Value Ref Range Status   Specimen Description BLOOD HAND LEFT  Final   Special Requests BOTTLES DRAWN AEROBIC AND ANAEROBIC 5CC  Final   Culture NO GROWTH 2 DAYS  Final   Report Status PENDING  Incomplete  Urine culture     Status: None   Collection Time: 03/10/15  1:34 PM  Result Value Ref Range Status   Specimen Description URINE, CLEAN CATCH  Final   Special Requests NONE  Final   Culture NO GROWTH 1 DAY  Final   Report Status 03/11/2015 FINAL  Final    Anti-infectives    Start     Dose/Rate Route Frequency Ordered Stop   03/13/15 2200  vancomycin (VANCOCIN) IVPB 750 mg/150 ml premix     750 mg 150 mL/hr over 60 Minutes Intravenous Every 12 hours 03/13/15 1240     03/10/15 1800  vancomycin (VANCOCIN) 500 mg in sodium chloride 0.9 % 100 mL IVPB  Status:  Discontinued     500 mg 100 mL/hr over 60 Minutes Intravenous Every 12 hours 03/10/15 0745 03/13/15 1240   03/10/15 1400  piperacillin-tazobactam (ZOSYN) IVPB 3.375 g     3.375 g 12.5 mL/hr over 240 Minutes Intravenous 3 times per day 03/10/15 0745     03/10/15 0430  vancomycin (VANCOCIN) IVPB 1000 mg/200 mL premix     1,000 mg 200 mL/hr over 60 Minutes Intravenous  Once 03/10/15 0428 03/10/15 0801   03/10/15 0430  piperacillin-tazobactam (ZOSYN) IVPB 3.375 g     3.375 g 12.5 mL/hr over 240 Minutes Intravenous  Once 03/10/15 0428 03/10/15 0554      Assessment: Admitted 03/10/2015 with SOB/PNA possible sepsis for Vancomycin and Zosyn.  ID: Vanco/Zosyn for R/O sepsis, COPD exacerbation/PNA, CAP, afeb, WBC 23.8 >31.7 today! (on steroids here and PTA), PCT 0.24, LA 1.99  Vanc 12/1>> --12/4: VT 10: dose increased 750/12h Zosyn 12/1>>  12/1 BCx x2 >>pending 12/1 UCx>>negative   Goal of Therapy:  Vancomycin trough level 15-20 mcg/ml  Plan:  Vanc 750mg  IV q12h Zosyn 3.375g IV q8hr.  Merilynn Finlandobertson, Levi StraussCrystal Stillinger 03/13/2015,12:40 PM

## 2015-03-13 NOTE — Progress Notes (Signed)
[facility]                                                                              Patient Demographics  Diamond Glass, is a 64 y.o. female, DOB - 01-25-1951, ZOX:[id number]  Admit date - 03/10/2015   Admitting Physician Eduard Clos, MD  Outpatient Primary MD for the patient is HOUT, Lowanda Foster, PA-C  LOS - 3   Chief Complaint  Patient presents with  . Shortness of Breath       Brief HPI   Diamond Glass is a 64 y.o. female with a history of COPD on 4 L of oxygen at home, hypertension, anxiety and pneumonia presented to ED with worsening shortness of breath since a day prior to admission. On the morning of admission, she woke up with respiratory distress and EMS was called, O2 sats were 86% on 4 L with diminished breath sounds. She continues to smoke the occasional cigarette, otherwise denied any recent fevers or chills, chest pain, palpitations. She has been intubated in the past. Workup in the ED showed ABG pH of 7.33, PCO2 63.9, O2 149 on BiPAP, initial troponin negative, BNP 178, basic metabolic panel significant for potassium of 3.4 serum glucose 187 chloride 97, CBC with WBC 33.1 hemoglobin 11.7, lactic acid 1.99. chest x-ray revealed right base infiltrate superimposed on severe COPD aspiration. EKG with sinus tach borderline QT prolongation similar to previous.  Assessment & Plan    Principal Problem: Acute on chronic respiratory failure related to COPD exacerbation, CAP pneumonia, sepsis - Per patient improving, continue bronchodilators, IV steroids and antibiotics  -Continue IV vancomycin and Zosyn, flutter valve -Chart review indicates in 2010 FEV1 38%  - Respiratory virus panel showed rhinovirus positive -Blood cultures negative to date - Urine strep antigen, urine legionella antigen negative - Leukocytosis trending down - CT chest showed no PE, small scattered patchy density involving the right lower lobe compatible with pneumonia  Active  problems COPD exacerbation in the setting of nicotine abuse, chronic respiratory failure: History of severe COPD -She is on 4 L nasal cannula at home , continue management as #1   Hyperglycemia related to steroids  -Patient on chronic steroids low-dose at home  -use sliding scale insulin as indicated    Hypertension.  -stable,  continue lisinopril   Prolonged QT. Mild per EKG  -Current EKG similar to past  -No chest pain, roponin neg    Anemia. Normocytic. Likely related to chronic disease  -Current hemoglobin at baseline   Anxiety/depression. Chronic  -Stable and at baseline, continue Prozac    Tobacco use.  -cessation counseling provided  -patient declines nicoderm   Code Status: Partial  Family Communication: Discussed in detail with the patient, all imaging results, lab results explained to the patient   Disposition Plan: *Hopefully next 24-48 hours  Time Spent in minutes   25 minutes  Procedures  CT chest  Consults   None  DVT Prophylaxis  Lovenox   Medications  Scheduled Meds: . antiseptic oral rinse  7 mL Mouth Rinse BID  . budesonide (PULMICORT) nebulizer solution  0.25 mg Nebulization BID  . enoxaparin (LOVENOX)  injection  30 mg Subcutaneous Daily  . FLUoxetine  80 mg Oral Daily  . insulin aspart  0-20 Units Subcutaneous TID WC  . insulin aspart  0-5 Units Subcutaneous QHS  . ipratropium-albuterol  3 mL Nebulization Q4H  . lisinopril  10 mg Oral Daily  . meloxicam  15 mg Oral Daily  . methylPREDNISolone (SOLU-MEDROL) injection  60 mg Intravenous Q12H  . piperacillin-tazobactam (ZOSYN)  IV  3.375 g Intravenous 3 times per day  . vancomycin  750 mg Intravenous Q12H   Continuous Infusions:  PRN Meds:.acetaminophen, ALPRAZolam, hydrALAZINE   Antibiotics   Anti-infectives    Start     Dose/Rate Route Frequency Ordered Stop   03/13/15 2200  vancomycin (VANCOCIN) IVPB 750 mg/150 ml premix     750 mg 150 mL/hr over 60 Minutes Intravenous Every 12  hours 03/13/15 1240     03/10/15 1800  vancomycin (VANCOCIN) 500 mg in sodium chloride 0.9 % 100 mL IVPB  Status:  Discontinued     500 mg 100 mL/hr over 60 Minutes Intravenous Every 12 hours 03/10/15 0745 03/13/15 1240   03/10/15 1400  piperacillin-tazobactam (ZOSYN) IVPB 3.375 g     3.375 g 12.5 mL/hr over 240 Minutes Intravenous 3 times per day 03/10/15 0745     03/10/15 0430  vancomycin (VANCOCIN) IVPB 1000 mg/200 mL premix     1,000 mg 200 mL/hr over 60 Minutes Intravenous  Once 03/10/15 0428 03/10/15 0801   03/10/15 0430  piperacillin-tazobactam (ZOSYN) IVPB 3.375 g     3.375 g 12.5 mL/hr over 240 Minutes Intravenous  Once 03/10/15 0428 03/10/15 0554        Subjective:   [name] was seen and examined today.  Appears anxious, diminished breath sounds throughout, states overall improving. Patient denies dizziness, abdominal pain, N/V/D/C, new weakness, numbess, tingling. No acute events overnight.    Objective:   Blood pressure 174/66, pulse 86, temperature 98.4 F (36.9 C), temperature source Oral, resp. rate 18, height  (1.575 m), weight 56.7 kg (125 lb), SpO2 95 %.  Wt Readings from Last 3 Encounters:  03/10/15 56.7 kg (125 lb)  11/30/14 58.3 kg (128 lb 8.5 oz)  04/10/14 60.011 kg (132 lb 4.8 oz)     Intake/Output Summary (Last 24 hours) at 03/13/15 1324 Last data filed at 03/13/15 0745  Gross per 24 hour  Intake   2105 ml  Output   1500 ml  Net    605 ml    Exam  General: Alert and oriented x 3, NAD, anxious  HEENT:  PERRLA, EOMI, Anicteric Sclera, mucous membranes moist.   Neck: Supple, no JVD, no masses  CVS: S1 S2 auscultated, no rubs, murmurs or gallops. Regular rate and rhythm.  Respiratory: Diminished breath sounds throughout  Abdomen: Soft, nontender, nondistended, + bowel sounds  Ext: no cyanosis clubbing or edema  Neuro: AAOx3, Cr N's II- XII. Strength 5/5 upper and lower extremities bilaterally  Skin: No rashes  Psych: Normal  affect and demeanor, alert and oriented x3    Data Review   Micro Results Recent Results (from the past 240 hour(s))  Blood culture (routine x 2)     Status: None (Preliminary result)   Collection Time: 03/10/15  5:05 AM  Result Value Ref Range Status   Specimen Description BLOOD LEFT ANTECUBITAL  Final   Special Requests BOTTLES DRAWN AEROBIC AND ANAEROBIC 5CC  Final   Culture NO GROWTH 2 DAYS  Final   Report Status PENDING  Incomplete  Blood culture (routine x 2)     Status: None (Preliminary result)   Collection Time: 03/10/15  5:11 AM  Result Value Ref Range Status   Specimen Description BLOOD HAND LEFT  Final   Special Requests BOTTLES DRAWN AEROBIC AND ANAEROBIC 5CC  Final   Culture NO GROWTH 2 DAYS  Final   Report Status PENDING  Incomplete  Urine culture     Status: None   Collection Time: 03/10/15  1:34 PM  Result Value Ref Range Status   Specimen Description URINE, CLEAN CATCH  Final   Special Requests NONE  Final   Culture NO GROWTH 1 DAY  Final   Report Status 03/11/2015 FINAL  Final    Radiology Reports Ct Angio Chest Pe W/cm &/or Wo Cm  03/12/2015  CLINICAL DATA:  64 year old female EXAM: CT ANGIOGRAPHY CHEST WITH CONTRAST TECHNIQUE: Multidetector CT imaging of the chest was performed using the standard protocol during bolus administration of intravenous contrast. Multiplanar CT image reconstructions and MIPs were obtained to evaluate the vascular anatomy. CONTRAST:  80mL OMNIPAQUE IOHEXOL 350 MG/ML SOLN COMPARISON:  Chest radiograph dated 03/11/2015 and CT dated 08/15/2007 FINDINGS: There is emphysematous changes of the lungs. Small airspace opacity predominantly involving the right lower lobe are concerning for developing pneumonia. Underlying mass is less likely but not excluded. Clinical correlation and follow-up to resolution is recommended. There is no pleural effusion or pneumothorax. Areas of subpleural reticular densities in the upper lobes may be related to  chronic changes and scarring or represent pneumonia. The central airways are patent. There is mild atherosclerotic calcification of the thoracic aorta. There is prominence of the main pulmonary trunk indicative of a degree of pulmonary hypertension. No CT evidence of pulmonary embolism. There is right hilar adenopathy. No mediastinal adenopathy. Mild cardiomegaly. No pericardial effusion. There is coronary vascular calcification. The visualized esophagus and thyroid gland are grossly unremarkable. There is mild diffuse subcutaneous soft tissue stranding of the chest wall. There is degenerative changes of the spine. No acute fracture. The visualized upper abdomen is grossly unremarkable. There is age indeterminate compression fracture of the superior endplate of the L1 vertebra with approximately 10% loss of vertebral body height. Clinical correlation is recommended. Review of the MIP images confirms the above findings. IMPRESSION: No CT evidence of pulmonary embolism. A small scattered patchy airspace opacity predominantly involving the right lower lobe most compatible with pneumonia. Clinical correlation and follow-up recommended. Electronically Signed   By: Elgie Collard M.D.   On: 03/12/2015 23:06   Dg Chest Port 1 View  03/11/2015  CLINICAL DATA:  COPD. EXAM: PORTABLE CHEST 1 VIEW COMPARISON:  03/10/2015. FINDINGS: Mediastinum hilar structures normal. Cardiomegaly with pulmonary vascular prominence and interstitial prominence with bilateral effusions. Findings are worse of from prior exam. Findings consistent with congestive heart failure. Right base infiltrate is improved. No pneumothorax. IMPRESSION: 1. Cardiomegaly with progressive pulmonary venous congestion, bilateral pulmonary interstitial prominence, and small pleural effusions consistent with congestive heart failure. 2.  Interim partial clearing of right base infiltrate. Electronically Signed   By: Maisie Fus  Register   On: 03/11/2015 07:52   Dg  Chest Port 1 View  03/10/2015  CLINICAL DATA:  Dyspnea, onset tonight. EXAM: PORTABLE CHEST 1 VIEW COMPARISON:  11/30/2014 FINDINGS: There is patchy airspace opacity in the lateral right base, superimposed on marked hyperinflation and emphysematous changes. Fibrotic appearing interstitial coarsening is present. No large effusions. No pneumothorax. IMPRESSION: Right base infiltrate superimposed on severe COPD, likely pneumonia. Electronically Signed  By: Ellery Plunkaniel R Mitchell M.D.   On: 03/10/2015 04:05    CBC  Recent Labs Lab 03/10/15 0427 03/11/15 0645 03/12/15 0558 03/13/15 0534  WBC 33.1* 23.8* 31.7* 18.8*  HGB 11.7* 9.5* 11.0* 10.0*  HCT 36.3 29.1* 35.7* 32.8*  PLT 377 269 452* 396  MCV 94.3 92.7 94.4 94.3  MCH 30.4 30.3 29.1 28.7  MCHC 32.2 32.6 30.8 30.5  RDW 12.8 12.8 13.0 13.1  LYMPHSABS 1.1  --   --  0.3*  MONOABS 1.1*  --   --  0.3  EOSABS 0.1  --   --  0.0  BASOSABS 0.0  --   --  0.0    Chemistries   Recent Labs Lab 03/10/15 0427 03/10/15 1849 03/11/15 0645 03/12/15 0558 03/13/15 0534  NA 137  --  140 137 136  K 3.4*  --  5.1 5.2* 4.8  CL 97*  --  104 103 101  CO2 31  --  29 25 28   GLUCOSE 187*  --  114* 137* 118*  BUN 23*  --  23* 33* 23*  CREATININE 0.95  --  0.72 0.81 0.75  CALCIUM 9.2  --  9.1 9.7 9.4  MG  --  2.0  --   --   --   AST  --   --  17  --  22  ALT  --   --  14  --  20  ALKPHOS  --   --  46  --  47  BILITOT  --   --  0.5  --  0.5   ------------------------------------------------------------------------------------------------------------------ estimated creatinine clearance is 54.7 mL/min (by C-G formula based on Cr of 0.75). ------------------------------------------------------------------------------------------------------------------  Recent Labs  03/10/15 1341  HGBA1C 5.3   ------------------------------------------------------------------------------------------------------------------ No results for input(s): CHOL, HDL,  LDLCALC, TRIG, CHOLHDL, LDLDIRECT in the last 72 hours. ------------------------------------------------------------------------------------------------------------------  Recent Labs  03/10/15 1932  TSH 0.611   ------------------------------------------------------------------------------------------------------------------ No results for input(s): VITAMINB12, FOLATE, FERRITIN, TIBC, IRON, RETICCTPCT in the last 72 hours.  Coagulation profile  Recent Labs Lab 03/10/15 1341  INR 1.02    No results for input(s): DDIMER in the last 72 hours.  Cardiac Enzymes  Recent Labs Lab 03/10/15 1849  TROPONINI 0.17*   ------------------------------------------------------------------------------------------------------------------ Invalid input(s): POCBNP   Recent Labs  03/12/15 0730 03/12/15 1147 03/12/15 1644 03/12/15 2127 03/13/15 0740 03/13/15 1132  GLUCAP 107* 103* 109* 116* 129* 129*     Hartwell Vandiver M.D. [facility] 03/13/2015, 1:24 PM  Pager: [phone] Between 7am to 7pm - call Pager - [phone]  After 7pm go to [email or web address] - password [email or web address]  Call night coverage person covering after 7pm

## 2015-03-14 DIAGNOSIS — F418 Other specified anxiety disorders: Secondary | ICD-10-CM

## 2015-03-14 DIAGNOSIS — J189 Pneumonia, unspecified organism: Secondary | ICD-10-CM | POA: Insufficient documentation

## 2015-03-14 DIAGNOSIS — R0602 Shortness of breath: Secondary | ICD-10-CM

## 2015-03-14 DIAGNOSIS — Z515 Encounter for palliative care: Secondary | ICD-10-CM | POA: Insufficient documentation

## 2015-03-14 DIAGNOSIS — J441 Chronic obstructive pulmonary disease with (acute) exacerbation: Secondary | ICD-10-CM

## 2015-03-14 DIAGNOSIS — D649 Anemia, unspecified: Secondary | ICD-10-CM

## 2015-03-14 DIAGNOSIS — Z7189 Other specified counseling: Secondary | ICD-10-CM | POA: Insufficient documentation

## 2015-03-14 LAB — CBC WITH DIFFERENTIAL/PLATELET
BASOS ABS: 0 10*3/uL (ref 0.0–0.1)
BASOS PCT: 0 %
Eosinophils Absolute: 0 10*3/uL (ref 0.0–0.7)
Eosinophils Relative: 0 %
HEMATOCRIT: 30.3 % — AB (ref 36.0–46.0)
Hemoglobin: 9.7 g/dL — ABNORMAL LOW (ref 12.0–15.0)
LYMPHS PCT: 4 %
Lymphs Abs: 0.5 10*3/uL — ABNORMAL LOW (ref 0.7–4.0)
MCH: 30 pg (ref 26.0–34.0)
MCHC: 32 g/dL (ref 30.0–36.0)
MCV: 93.8 fL (ref 78.0–100.0)
Monocytes Absolute: 0.5 10*3/uL (ref 0.1–1.0)
Monocytes Relative: 4 %
NEUTROS ABS: 11.9 10*3/uL — AB (ref 1.7–7.7)
Neutrophils Relative %: 92 %
PLATELETS: 334 10*3/uL (ref 150–400)
RBC: 3.23 MIL/uL — AB (ref 3.87–5.11)
RDW: 12.8 % (ref 11.5–15.5)
WBC: 12.9 10*3/uL — AB (ref 4.0–10.5)

## 2015-03-14 LAB — GLUCOSE, CAPILLARY
GLUCOSE-CAPILLARY: 96 mg/dL (ref 65–99)
GLUCOSE-CAPILLARY: 131 mg/dL — AB (ref 65–99)
GLUCOSE-CAPILLARY: 141 mg/dL — AB (ref 65–99)
Glucose-Capillary: 111 mg/dL — ABNORMAL HIGH (ref 65–99)

## 2015-03-14 LAB — BASIC METABOLIC PANEL
ANION GAP: 8 (ref 5–15)
BUN: 22 mg/dL — ABNORMAL HIGH (ref 6–20)
CO2: 31 mmol/L (ref 22–32)
Calcium: 9.5 mg/dL (ref 8.9–10.3)
Chloride: 96 mmol/L — ABNORMAL LOW (ref 101–111)
Creatinine, Ser: 0.81 mg/dL (ref 0.44–1.00)
GLUCOSE: 111 mg/dL — AB (ref 65–99)
POTASSIUM: 4.7 mmol/L (ref 3.5–5.1)
Sodium: 135 mmol/L (ref 135–145)

## 2015-03-14 MED ORDER — HYDRALAZINE HCL 20 MG/ML IJ SOLN
10.0000 mg | Freq: Four times a day (QID) | INTRAMUSCULAR | Status: DC | PRN
Start: 2015-03-14 — End: 2015-03-16

## 2015-03-14 MED ORDER — AMLODIPINE BESYLATE 5 MG PO TABS
5.0000 mg | ORAL_TABLET | Freq: Every day | ORAL | Status: DC
  Administered 2015-03-14 – 2015-03-15 (×2): 5 mg via ORAL
  Filled 2015-03-14 (×2): qty 1

## 2015-03-14 MED ORDER — ARFORMOTEROL TARTRATE 15 MCG/2ML IN NEBU: 15.0000 ug | INHALATION_SOLUTION | Freq: Two times a day (BID) | RESPIRATORY_TRACT | Status: DC

## 2015-03-14 MED ORDER — ALPRAZOLAM 0.5 MG PO TABS
1.0000 mg | ORAL_TABLET | Freq: Three times a day (TID) | ORAL | Status: DC | PRN
  Administered 2015-03-14 – 2015-03-16 (×6): 1 mg via ORAL
  Filled 2015-03-14 (×6): qty 2

## 2015-03-14 MED ORDER — ALBUTEROL SULFATE (2.5 MG/3ML) 0.083% IN NEBU: 2.5000 mg | INHALATION_SOLUTION | RESPIRATORY_TRACT | Status: DC | PRN

## 2015-03-14 MED ORDER — OXYCODONE HCL 5 MG PO TABS: 5.0000 mg | ORAL_TABLET | ORAL | Status: DC | PRN

## 2015-03-14 MED ORDER — BUDESONIDE 0.5 MG/2ML IN SUSP: 0.5000 mg | Freq: Two times a day (BID) | RESPIRATORY_TRACT | Status: DC

## 2015-03-14 NOTE — Consult Note (Signed)
[facility] referral from [place].  Met with pt at the bedside to discuss Hospice services in the home.  Pt states that she was expecting my visit and has been told that her lung dz is in the "end stages".  She lives in [place] with her spouse Mallie Mussel and one of her 2 daughters--Allison Comfort.  Ebony Hail is her PCG but has her own physical problems as well.  Pt already has the following DME in the home:  O2 Concentrator and small back-up tanks through [employer].  She also has a walker, and 3 in 1 bedside commode.  Reviewed with pt the Hospice Medicare Benefit including multidisciplinary team making home visits, covered DME and medical supplies and Hospice covered med formulary.  Discussed the Hospice philosophy and comfort approach to her dz process.  Pt seems interested in Hospice support when she returns home but is not ready to make a final decision until she talks to her family about it.  She did give this RN permission to reach out to her dtr Ebony Hail.  Call placed to Allison's cell # [phone] but she did not answer and the VM was not set up to leave a message.   We will reach out to pt/family again tomorrow to see if they have made a final decision.  Thank you for referral and opportunity to serve pt and family.  Wynetta Fines, RN

## 2015-03-14 NOTE — Progress Notes (Signed)
   03/14/15 1100  Clinical Encounter Type  Visited With Patient  Visit Type Psychological support;Spiritual support;Social support;Critical Care  Referral From Nurse  Spiritual Encounters  Spiritual Needs Emotional  Stress Factors  Patient Stress Factors Family relationships    Chaplain sat with Pt. And provided emotional care. Please page if Pt. Is in need of more care. Thanks

## 2015-03-14 NOTE — Consult Note (Signed)
Consultation Note Date: 03/14/2015   Patient Name: Diamond Glass  DOB: October 15, 1950  MRN: [id number]  Age / Sex: 64 y.o., female  PCP: [name], PA-C Referring Physician: Cathren Harsh, MD  Reason for Consultation: Establishing goals of care    Clinical Assessment/Narrative:   Patient is a 64 yo lady with advanced COPD, on 3-4L home O2, lives in [place], [place] with daughter and husband. Patient was intubated at [facility] in 2009 for COPD. Patient has anxiety and depression, worsening respiratory status and recurrent hospitalizations.  Patient is currently admitted with acute on chronic hypoxic respiratory failure, she has been placed on NEBs, antibiotics, she has been seen by Pulmonary.  Patient has elected DNI status. Palliative consult for further goals of care discussions and symptom management Patient resting in bed, daughter at bedside, there is a severe stressor going on in the family with relation to the patient's grand son, daughter Allison's son. The details are unknown to me, but the patient states that she has had this exacerbation because of her recent stressor.   Daughter describes that the patient has been having frequent anxiety/ panic attacks, she has been having worsening dyspnea at home. Patient describes that during an attack of dyspnea, she feels like she is "choking to death" because she cannot breathe.   Discussed symptom management of dyspnea and anxiety: will increase Xanax dose, continue Prozac and cautiously start low dose OxyIR while the patient is hospitalized.  Discussed code status, discussed DNR in great detail, patient now DNR DNI.  Patient and daughter familiar with [facility]: consult for case management to arrange for home with hospice Chaplain consult to complete HCPOA paperwork  All questions answered.  Thank you for the consult.    Contacts/Participants  in Discussion: Primary Decision Maker:  Patient herself who is decisional, then she elects her daughter Diamond Glass   Relationship to Patient daughter HCPOA: no   chaplain consult requested to facilitate completion of HCPOA document.   SUMMARY OF RECOMMENDATIONS: 1. After extensive discussions, DNR DNI has been established.  2. Symptom management with low dose OxyIR, increased dose of Xanax 3. Case management consult for home with hospice on discharge, call placed and discussed with case management. Patient lives in [place], [place] with daughter, elects [facility]. They need hospital bed, shower chair for durable medical equipment.  4. Chaplain to complete HCPOA paperwork, patient elects daughter Diamond Glass to be her HCPOA agent.    Code Status/Advance Care Planning: DNR    Code Status Orders        Start     Ordered   03/14/15 1402  Do not attempt resuscitation (DNR)   Continuous    Question Answer Comment  In the event of cardiac or respiratory ARREST Do not call a "code blue"   In the event of cardiac or respiratory ARREST Do not perform Intubation, CPR, defibrillation or ACLS   In the event of cardiac or respiratory ARREST Use medication by any route, position, wound care, and other measures to relive pain and suffering. May use oxygen, suction and manual treatment of airway obstruction as needed for comfort.   Comments BiPaP if needed      03/14/15 1401      Other Directives:Other  Symptom Management:    anxiety: increase Xanax PRN dose, continue Prozac   Dyspnea: patient allergic to Morphine, patient reports agitation, history of hallucinations when she was given IV Morphine in the hospital in 2009. Will  try low dose OxyIR PRN for dyspnea as the patient has taken hydrocodone in the past without any complications. Will monitor.   Palliative Prophylaxis:   Delirium Protocol  Additional Recommendations (Limitations, Scope, Preferences):   home with hospic  e    Psycho-social/Spiritual:  Support System: Fair Desire for further Chaplaincy support:yes Additional Recommendations: Education on Hospice  Prognosis: weeks to months, discussed with patient and daughter.   Discharge Planning: Home with Hospice   Chief Complaint/ Primary Diagnoses: Present on Admission:  . Sepsis (HCC) . Acute on chronic respiratory failure (HCC) . COPD exacerbation (HCC) . Essential hypertension . Community acquired pneumonia . Chronic anemia . Anxiety and depression . Prolonged QT interval . Hypokalemia . Hyperglycemia . Pneumonia  I have reviewed the medical record, interviewed the patient and family, and examined the patient. The following aspects are pertinent.  Past Medical History  Diagnosis Date  . COPD (chronic obstructive pulmonary disease) (HCC)   . Hypertension   . Chronic respiratory failure (HCC)   . Anxiety   . Acute on chronic respiratory failure (HCC)   . Sepsis (HCC)   . PNA (pneumonia)   . Depression    Social History   Social History  . Marital Status: Married    Spouse Name: N/A  . Number of Children: N/A  . Years of Education: N/A   Social History Main Topics  . Smoking status: Former Smoker -- 4.00 packs/day for 50 years    Types: Cigarettes    Quit date: 04/09/2008  . Smokeless tobacco: None  . Alcohol Use: No  . Drug Use: No  . Sexual Activity: No   Other Topics Concern  . None   Social History Narrative   No family history on file. Scheduled Meds: . amLODipine  5 mg Oral Daily  . antiseptic oral rinse  7 mL Mouth Rinse BID  . enoxaparin (LOVENOX) injection  30 mg Subcutaneous Daily  . FLUoxetine  80 mg Oral Daily  . insulin aspart  0-20 Units Subcutaneous TID WC  . insulin aspart  0-5 Units Subcutaneous QHS  . ipratropium-albuterol  3 mL Nebulization Q4H  . lisinopril  10 mg Oral Daily  . meloxicam  15 mg Oral Daily  . methylPREDNISolone (SOLU-MEDROL) injection  60 mg Intravenous Q12H  .  piperacillin-tazobactam (ZOSYN)  IV  3.375 g Intravenous 3 times per day  . vancomycin  750 mg Intravenous Q12H   Continuous Infusions:  PRN Meds:.acetaminophen, albuterol, ALPRAZolam, hydrALAZINE, oxyCODONE Medications Prior to Admission:  Prior to Admission medications   Medication Sig Start Date End Date Taking? Authorizing Provider  albuterol (PROVENTIL HFA;VENTOLIN HFA) 108 (90 BASE) MCG/ACT inhaler Inhale 1-2 puffs into the lungs every 6 (six) hours as needed for wheezing or shortness of breath.   Yes Historical Provider, MD  ALPRAZolam [name]) 0.5 MG tablet Take 0.5 mg by mouth 3 (three) times daily as needed. For anxiety.    Yes Historical Provider, MD  budesonide-formoterol (SYMBICORT) 160-4.5 MCG/ACT inhaler Inhale 2 puffs into the lungs 2 (two) times daily.     Yes Historical Provider, MD  FLUoxetine (PROZAC) 40 MG capsule Take 80 mg by mouth daily.   Yes Historical Provider, MD  lisinopril (PRINIVIL,ZESTRIL) 10 MG tablet Take 10 mg by mouth daily.   Yes Historical Provider, MD  meloxicam (MOBIC) 15 MG tablet Take 15 mg by mouth daily.   Yes Historical Provider, MD  predniSONE (DELTASONE) 10 MG tablet Take 10 mg by mouth daily with breakfast.   Yes  Historical Provider, MD  tiotropium (SPIRIVA) 18 MCG inhalation capsule Place 18 mcg into inhaler and inhale daily.     Yes Historical Provider, MD  predniSONE (DELTASONE) 20 MG tablet Take 1 tablet (20 mg total) by mouth daily with breakfast. 40 mg (2 tabs)qd 4 days, 20 mg (1 tab)qd next 4 days, 10 mg (half a tab)qd 4 days. Patient not taking: Reported on 03/10/2015 12/01/14   [name], MD   Allergies  Allergen Reactions  . Codeine     REACTION: vomit  . Morphine And Related Other (See Comments)    "makes me crazy"    Review of Systems Positive for uncontrolled dyspnea anxiety panic attacks  Physical Exam  Thin cachectic lady mild distress Using accessory abdominal muscles of respiration appears uncomfortable is not able to  speak in full sentences for long Diminished S1 S2 Abdomen soft No edema Extremities warm dry   Vital Signs: BP 159/84 mmHg  Pulse 91  Temp(Src) 98.8 F (37.1 C) (Oral)  Resp 16  Ht 5\' 2"  (1.575 m)  Wt 56.7 kg (125 lb)  BMI 22.86 kg/m2  SpO2 98%  SpO2: SpO2: 98 % O2 Device:SpO2: 98 % O2 Flow Rate: .O2 Flow Rate (L/min): 4 L/min  IO: Intake/output summary:  Intake/Output Summary (Last 24 hours) at 03/14/15 1427 Last data filed at 03/14/15 1142  Gross per 24 hour  Intake    200 ml  Output   1175 ml  Net   -975 ml    LBM: Last BM Date: 03/13/15 Baseline Weight: Weight: 56.7 kg (125 lb) Most recent weight: Weight: 56.7 kg (125 lb)      Palliative Assessment/Data:  Flowsheet Rows        Most Recent Value   Intake Tab    Referral Department  Hospitalist   Unit at Time of Referral  Med/Surg Unit   Palliative Care Primary Diagnosis  Pulmonary   Date Notified  03/14/15   Palliative Care Type  New Palliative care   Reason for referral  Counsel Regarding Hospice, Clarify Goals of Care, Non-pain Symptom   Date of Admission  03/10/15   # of days IP prior to Palliative referral  4   Clinical Assessment    Pain Max last 24 hours  3   Pain Min Last 24 hours  2   Dyspnea Max Last 24 Hours  8   Dyspnea Min Last 24 hours  7   Anxiety Max Last 24 Hours  6   Anxiety Min Last 24 Hours  5   Psychosocial & Spiritual Assessment    Social Work Plan of Care  Education on Hospice   Palliative Care Outcomes    Patient/Family meeting held?  No   Palliative Care Outcomes  Clarified goals of care, Transitioned to hospice   Palliative Care follow-up planned  Yes, Facility      Additional Data Reviewed:  CBC:    Component Value Date/Time   WBC 12.9* 03/14/2015 0856   HGB 9.7* 03/14/2015 0856   HCT 30.3* 03/14/2015 0856   PLT 334 03/14/2015 0856   MCV 93.8 03/14/2015 0856   NEUTROABS 11.9* 03/14/2015 0856   LYMPHSABS 0.5* 03/14/2015 0856   MONOABS 0.5 03/14/2015 0856   EOSABS  0.0 03/14/2015 0856   BASOSABS 0.0 03/14/2015 0856   Comprehensive Metabolic Panel:    Component Value Date/Time   NA 135 03/14/2015 0856   K 4.7 03/14/2015 0856   CL 96* 03/14/2015 0856   CO2 31 03/14/2015 0856  BUN 22* 03/14/2015 0856   CREATININE 0.81 03/14/2015 0856   GLUCOSE 111* 03/14/2015 0856   CALCIUM 9.5 03/14/2015 0856   AST 22 03/13/2015 0534   ALT 20 03/13/2015 0534   ALKPHOS 47 03/13/2015 0534   BILITOT 0.5 03/13/2015 0534   PROT 5.9* 03/13/2015 0534   ALBUMIN 3.1* 03/13/2015 0534     Time In: 1300 Time Out: 1400 Time Total 60 min  Greater than 50%  of this time was spent counseling and coordinating care related to the above assessment and plan.  Signed by: Rosalin HawkingZeba Caris Cerveny, MD [phone] Rosalin HawkingZeba Jonnie Truxillo, MD  03/14/2015, 2:27 PM  Please contact Palliative Medicine Team phone at [phone] for questions and concerns.

## 2015-03-14 NOTE — Progress Notes (Signed)
PT Cancellation Note  Patient Details Name: Diamond Glass MRN: [id number] DOB: Aug 26, 1950   Cancelled Treatment:    Reason Eval/Treat Not Completed: Other (comment). Earlier pt receiving breathing treatment and now pt fatigued after having a bad day. Agreed best to defer until tomorrow.  [name] 03/14/2015, 4:06 PM Fluor CorporationCary Astin Rape PT [phone]

## 2015-03-14 NOTE — Progress Notes (Signed)
[facility]                                                                              Patient Demographics  Diamond Glass, is a 64 y.o. female, DOB - Jul 22, 1950, NWG:[id number]  Admit date - 03/10/2015   Admitting Physician Eduard Clos, MD  Outpatient Primary MD for the patient is HOUT, Lowanda Foster, PA-C  LOS - 4   Chief Complaint  Patient presents with  . Shortness of Breath       Brief HPI   Diamond Glass is a 64 y.o. female with a history of COPD on 4 L of oxygen at home, hypertension, anxiety and pneumonia presented to ED with worsening shortness of breath since a day prior to admission. On the morning of admission, she woke up with respiratory distress and EMS was called, O2 sats were 86% on 4 L with diminished breath sounds. She continues to smoke the occasional cigarette, otherwise denied any recent fevers or chills, chest pain, palpitations. She has been intubated in the past. Workup in the ED showed ABG pH of 7.33, PCO2 63.9, O2 149 on BiPAP, initial troponin negative, BNP 178, basic metabolic panel significant for potassium of 3.4 serum glucose 187 chloride 97, CBC with WBC 33.1 hemoglobin 11.7, lactic acid 1.99. chest x-ray revealed right base infiltrate superimposed on severe COPD aspiration. EKG with sinus tach borderline QT prolongation similar to previous.  Assessment & Plan    Principal Problem: Acute on chronic respiratory failure related to COPD exacerbation, CAP pneumonia, sepsis - No significant improvement, still tight and wheezy, continue bronchodilators, IV steroids and antibiotics  -Continue IV vancomycin and Zosyn, flutter valve, Chart review indicates in 2010 FEV1 38%  - Respiratory virus panel showed rhinovirus positive, blood cultures negative, urine strep antigen, urine legionella antigen negative - CT chest showed no PE, small scattered patchy density involving the right lower lobe compatible with pneumonia - Patient follows  pulmonologist in [place], requested pulmonology consult  Active problems COPD exacerbation in the setting of nicotine abuse, chronic respiratory failure: History of severe COPD -She is on 4 L nasal cannula at home , continue management as #1  Hyperglycemia related to steroids  -Patient on chronic steroids low-dose at home  -use sliding scale insulin as indicated   Hypertension. : Somewhat elevated today -Continue lisinopril, Norvasc 5 mg daily, hydralazine as needed  Prolonged QT. Mild per EKG  -Current EKG similar to past  -No chest pain, roponin neg    Anemia. Normocytic. Likely related to chronic disease  -Current hemoglobin at baseline   Anxiety/depression. Chronic  -Stable and at baseline, continue Prozac    Tobacco use.  -cessation counseling provided  -patient declines nicoderm   Code Status: Partial  Family Communication: Discussed in detail with the patient, all imaging results, lab results explained to the patient   Disposition Plan: *Hopefully next 24-48 hours  Time Spent in minutes   25 minutes  Procedures  CT chest  Consults   Pulmonology  DVT Prophylaxis  Lovenox   Medications  Scheduled Meds: . antiseptic oral rinse  7 mL Mouth Rinse BID  . enoxaparin (LOVENOX)  injection  30 mg Subcutaneous Daily  . FLUoxetine  80 mg Oral Daily  . insulin aspart  0-20 Units Subcutaneous TID WC  . insulin aspart  0-5 Units Subcutaneous QHS  . ipratropium-albuterol  3 mL Nebulization Q4H  . lisinopril  10 mg Oral Daily  . meloxicam  15 mg Oral Daily  . methylPREDNISolone (SOLU-MEDROL) injection  60 mg Intravenous Q12H  . piperacillin-tazobactam (ZOSYN)  IV  3.375 g Intravenous 3 times per day  . vancomycin  750 mg Intravenous Q12H   Continuous Infusions:  PRN Meds:.acetaminophen, albuterol, ALPRAZolam, hydrALAZINE   Antibiotics   Anti-infectives    Start     Dose/Rate Route Frequency Ordered Stop   03/13/15 2200  vancomycin (VANCOCIN) IVPB 750  mg/150 ml premix     750 mg 150 mL/hr over 60 Minutes Intravenous Every 12 hours 03/13/15 1240     03/10/15 1800  vancomycin (VANCOCIN) 500 mg in sodium chloride 0.9 % 100 mL IVPB  Status:  Discontinued     500 mg 100 mL/hr over 60 Minutes Intravenous Every 12 hours 03/10/15 0745 03/13/15 1240   03/10/15 1400  piperacillin-tazobactam (ZOSYN) IVPB 3.375 g     3.375 g 12.5 mL/hr over 240 Minutes Intravenous 3 times per day 03/10/15 0745     03/10/15 0430  vancomycin (VANCOCIN) IVPB 1000 mg/200 mL premix     1,000 mg 200 mL/hr over 60 Minutes Intravenous  Once 03/10/15 0428 03/10/15 0801   03/10/15 0430  piperacillin-tazobactam (ZOSYN) IVPB 3.375 g     3.375 g 12.5 mL/hr over 240 Minutes Intravenous  Once 03/10/15 0428 03/10/15 0554        Subjective:   [facility] and examined today.  Still tight and wheezing, very anxious and shaky. Having headaches due to hypertension. Patient denies dizziness, abdominal pain, N/V/D/C, new weakness, numbess, tingling. No acute events overnight.    Objective:   Blood pressure 159/84, pulse 91, temperature 98.8 F (37.1 C), temperature source Oral, resp. rate 18, height 5\' 2"  (1.575 m), weight 56.7 kg (125 lb), SpO2 100 %.  Wt Readings from Last 3 Encounters:  03/10/15 56.7 kg (125 lb)  11/30/14 58.3 kg (128 lb 8.5 oz)  04/10/14 60.011 kg (132 lb 4.8 oz)     Intake/Output Summary (Last 24 hours) at 03/14/15 1140 Last data filed at 03/14/15 [id number]  Gross per 24 hour  Intake    440 ml  Output    950 ml  Net   -510 ml    Exam  General: Alert and oriented x 3, NAD, anxious  HEENT:  PERRLA, EOMI, Anicteric Sclera, mucous membranes moist.   Neck: Supple, no JVD, no masses  CVS: S1 S2 clear, RRR  Respiratory: Diminished breath sounds throughout  Abdomen: Soft, nontender, nondistended, + bowel sounds  Ext: no cyanosis clubbing or edema  Neuro: no new deficits  Skin: No rashes  Psych: Normal affect and demeanor, alert and  oriented x3    Data Review   Micro Results Recent Results (from the past 240 hour(s))  Blood culture (routine x 2)     Status: None (Preliminary result)   Collection Time: 03/10/15  5:05 AM  Result Value Ref Range Status   Specimen Description BLOOD LEFT ANTECUBITAL  Final   Special Requests BOTTLES DRAWN AEROBIC AND ANAEROBIC 5CC  Final   Culture NO GROWTH 3 DAYS  Final   Report Status PENDING  Incomplete  Blood culture (routine x 2)     Status:  None (Preliminary result)   Collection Time: 03/10/15  5:11 AM  Result Value Ref Range Status   Specimen Description BLOOD HAND LEFT  Final   Special Requests BOTTLES DRAWN AEROBIC AND ANAEROBIC 5CC  Final   Culture NO GROWTH 3 DAYS  Final   Report Status PENDING  Incomplete  Urine culture     Status: None   Collection Time: 03/10/15  1:34 PM  Result Value Ref Range Status   Specimen Description URINE, CLEAN CATCH  Final   Special Requests NONE  Final   Culture NO GROWTH 1 DAY  Final   Report Status 03/11/2015 FINAL  Final    Radiology Reports Ct Angio Chest Pe W/cm &/or Wo Cm  03/12/2015  CLINICAL DATA:  64 year old female EXAM: CT ANGIOGRAPHY CHEST WITH CONTRAST TECHNIQUE: Multidetector CT imaging of the chest was performed using the standard protocol during bolus administration of intravenous contrast. Multiplanar CT image reconstructions and MIPs were obtained to evaluate the vascular anatomy. CONTRAST:  80mL OMNIPAQUE IOHEXOL 350 MG/ML SOLN COMPARISON:  Chest radiograph dated 03/11/2015 and CT dated 08/15/2007 FINDINGS: There is emphysematous changes of the lungs. Small airspace opacity predominantly involving the right lower lobe are concerning for developing pneumonia. Underlying mass is less likely but not excluded. Clinical correlation and follow-up to resolution is recommended. There is no pleural effusion or pneumothorax. Areas of subpleural reticular densities in the upper lobes may be related to chronic changes and scarring or  represent pneumonia. The central airways are patent. There is mild atherosclerotic calcification of the thoracic aorta. There is prominence of the main pulmonary trunk indicative of a degree of pulmonary hypertension. No CT evidence of pulmonary embolism. There is right hilar adenopathy. No mediastinal adenopathy. Mild cardiomegaly. No pericardial effusion. There is coronary vascular calcification. The visualized esophagus and thyroid gland are grossly unremarkable. There is mild diffuse subcutaneous soft tissue stranding of the chest wall. There is degenerative changes of the spine. No acute fracture. The visualized upper abdomen is grossly unremarkable. There is age indeterminate compression fracture of the superior endplate of the L1 vertebra with approximately 10% loss of vertebral body height. Clinical correlation is recommended. Review of the MIP images confirms the above findings. IMPRESSION: No CT evidence of pulmonary embolism. A small scattered patchy airspace opacity predominantly involving the right lower lobe most compatible with pneumonia. Clinical correlation and follow-up recommended. Electronically Signed   By: Elgie Collard M.D.   On: 03/12/2015 23:06   Dg Chest Port 1 View  03/11/2015  CLINICAL DATA:  COPD. EXAM: PORTABLE CHEST 1 VIEW COMPARISON:  03/10/2015. FINDINGS: Mediastinum hilar structures normal. Cardiomegaly with pulmonary vascular prominence and interstitial prominence with bilateral effusions. Findings are worse of from prior exam. Findings consistent with congestive heart failure. Right base infiltrate is improved. No pneumothorax. IMPRESSION: 1. Cardiomegaly with progressive pulmonary venous congestion, bilateral pulmonary interstitial prominence, and small pleural effusions consistent with congestive heart failure. 2.  Interim partial clearing of right base infiltrate. Electronically Signed   By: Maisie Fus  Register   On: 03/11/2015 07:52   Dg Chest Port 1 View  03/10/2015   CLINICAL DATA:  Dyspnea, onset tonight. EXAM: PORTABLE CHEST 1 VIEW COMPARISON:  11/30/2014 FINDINGS: There is patchy airspace opacity in the lateral right base, superimposed on marked hyperinflation and emphysematous changes. Fibrotic appearing interstitial coarsening is present. No large effusions. No pneumothorax. IMPRESSION: Right base infiltrate superimposed on severe COPD, likely pneumonia. Electronically Signed   By: Ellery Plunk M.D.   On: 03/10/2015 04:05  CBC  Recent Labs Lab 03/10/15 0427 03/11/15 0645 03/12/15 0558 03/13/15 0534 03/14/15 0856  WBC 33.1* 23.8* 31.7* 18.8* 12.9*  HGB 11.7* 9.5* 11.0* 10.0* 9.7*  HCT 36.3 29.1* 35.7* 32.8* 30.3*  PLT 377 269 452* 396 334  MCV 94.3 92.7 94.4 94.3 93.8  MCH 30.4 30.3 29.1 28.7 30.0  MCHC 32.2 32.6 30.8 30.5 32.0  RDW 12.8 12.8 13.0 13.1 12.8  LYMPHSABS 1.1  --   --  0.3* 0.5*  MONOABS 1.1*  --   --  0.3 0.5  EOSABS 0.1  --   --  0.0 0.0  BASOSABS 0.0  --   --  0.0 0.0    Chemistries   Recent Labs Lab 03/10/15 0427 03/10/15 1849 03/11/15 0645 03/12/15 0558 03/13/15 0534 03/14/15 0856  NA 137  --  140 137 136 135  K 3.4*  --  5.1 5.2* 4.8 4.7  CL 97*  --  104 103 101 96*  CO2 31  --  GLUCOSE 187*  --  114* 137* 118* 111*  BUN 23*  --  23* 33* 23* 22*  CREATININE 0.95  --  0.72 0.81 0.75 0.81  CALCIUM 9.2  --  9.1 9.7 9.4 9.5  MG  --  2.0  --   --   --   --   AST  --   --  17  --  22  --   ALT  --   --  14  --  20  --   ALKPHOS  --   --  46  --  47  --   BILITOT  --   --  0.5  --  0.5  --    ------------------------------------------------------------------------------------------------------------------ estimated creatinine clearance is 54 mL/min (by C-G formula based on Cr of 0.81). ------------------------------------------------------------------------------------------------------------------ No results for input(s): HGBA1C in the last 72  hours. ------------------------------------------------------------------------------------------------------------------ No results for input(s): CHOL, HDL, LDLCALC, TRIG, CHOLHDL, LDLDIRECT in the last 72 hours. ------------------------------------------------------------------------------------------------------------------ No results for input(s): TSH, T4TOTAL, T3FREE, THYROIDAB in the last 72 hours.  Invalid input(s): FREET3 ------------------------------------------------------------------------------------------------------------------ No results for input(s): VITAMINB12, FOLATE, FERRITIN, TIBC, IRON, RETICCTPCT in the last 72 hours.  Coagulation profile  Recent Labs Lab 03/10/15 1341  INR 1.02    No results for input(s): DDIMER in the last 72 hours.  Cardiac Enzymes  Recent Labs Lab 03/10/15 1849  TROPONINI 0.17*   ------------------------------------------------------------------------------------------------------------------ Invalid input(s): POCBNP   Recent Labs  03/12/15 2127 03/13/15 0740 03/13/15 1132 03/13/15 1650 03/13/15 2104 03/14/15 0809  GLUCAP 116* 129* 129* 105* 170* 96     Norman Piacentini M.D. [facility] 03/14/2015, 11:40 AM  Pager: [phone] Between 7am to 7pm - call Pager - [phone]  After 7pm go to [email or web address] - password [email or web address]  Call night coverage person covering after 7pm

## 2015-03-14 NOTE — Consult Note (Signed)
Name: Diamond Glass MRN: [id number] DOB: [birth year]    ADMISSION DATE:  03/10/2015 CONSULTATION DATE:  03/10/15  REFERRING MD :  Isidoro Donning  CHIEF COMPLAINT:  Dyspnea  BRIEF PATIENT DESCRIPTION: 64 y.o. female  With severe COPD (PFT's from 2010 with FEV1 38% pred), brought to [facility] 12/1 due to worsening SOB.  She was admitted with AECOPD, HCAP, and AoC hypoxic respiratory failure.  PCCM called 12/5 due to persistent dyspnea.  SIGNIFICANT EVENTS  12/1 > admit. 12/5 > PCCM consulted.  STUDIES:  CXR 12/2 > right basilar infiltrate superimposed on severe COPD. CTA chest 12/3 > no PE.  RLL PNA.   HISTORY OF PRESENT ILLNESS:  Diamond Glass is a 64 y.o. female with a PMH as outlined below including but not limited to severe COPD for which she is chronically O2 dependent (3 - 4 L).  She presented to [facility] ED 12/1 with SOB worse compared to baseline.  Symptoms had began 1 day prior when she woke up with worsening SOB.  Symptoms persisted into the next day and when she woke up again, she felt as if she was in respiratory distress; therefore, she called EMS.  [facility] arrival, SpO2 was 86% on 4L O2 and she had diminished breath sounds.  CXR in ED revealed right basilar infiltrate along with severe known COPD.  She was admitted for acute on chronic hypoxic respiratory failure, HCAP and COPD exacerbation.  This is her 3rd admission this year (2 in past 6 months).  On 12/5, PCCM was consulted due to persistent dyspnea.  She continues to smoke the occasional cigarette and has a 175 PY history (3.5 PPD).  She also grew up in a home that had a wood burning stove.  No additional significant smoke exposures.   She sees pulmonology in [facility] Rosina Lowenstein, NP), is due back for a follow up visit in January 2017.  There had been discussion of pulmonary rehab as well as palliative care / hospice referral in the past (she did decline pulmonary rehab at the time).  Her outpatient regimen (managed by the provider  mentioned above) includes Albuterol, Proventil, Symbicort, Spiriva.  PFT's from 2010:  FEV1: 0.87 (38% pred) pre BD / 1.05 (46%) post BD.  FEV1/FVC: 37 pre / 44 post, FVC: 2.35 (76% pred).  Per her report, she has been intubated in the past but now wishes for NO INTUBATION.   PAST MEDICAL HISTORY :   has a past medical history of COPD (chronic obstructive pulmonary disease) (HCC); Hypertension; Chronic respiratory failure (HCC); Anxiety; Acute on chronic respiratory failure (HCC); Sepsis (HCC); PNA (pneumonia); and Depression.  has past surgical history that includes Abdominal hysterectomy. Prior to Admission medications   Medication Sig Start Date End Date Taking? Authorizing Provider  albuterol (PROVENTIL HFA;VENTOLIN HFA) 108 (90 BASE) MCG/ACT inhaler Inhale 1-2 puffs into the lungs every 6 (six) hours as needed for wheezing or shortness of breath.   Yes Historical Provider, MD  ALPRAZolam [name]) 0.5 MG tablet Take 0.5 mg by mouth 3 (three) times daily as needed. For anxiety.    Yes Historical Provider, MD  budesonide-formoterol (SYMBICORT) 160-4.5 MCG/ACT inhaler Inhale 2 puffs into the lungs 2 (two) times daily.     Yes Historical Provider, MD  FLUoxetine (PROZAC) 40 MG capsule Take 80 mg by mouth daily.   Yes Historical Provider, MD  lisinopril (PRINIVIL,ZESTRIL) 10 MG tablet Take 10 mg by mouth daily.   Yes Historical Provider, MD  meloxicam (MOBIC) 15 MG  tablet Take 15 mg by mouth daily.   Yes Historical Provider, MD  predniSONE (DELTASONE) 10 MG tablet Take 10 mg by mouth daily with breakfast.   Yes Historical Provider, MD  tiotropium (SPIRIVA) 18 MCG inhalation capsule Place 18 mcg into inhaler and inhale daily.     Yes Historical Provider, MD  predniSONE (DELTASONE) 20 MG tablet Take 1 tablet (20 mg total) by mouth daily with breakfast. 40 mg (2 tabs)qd 4 days, 20 mg (1 tab)qd next 4 days, 10 mg (half a tab)qd 4 days. Patient not taking: Reported on 03/10/2015 12/01/14   Darreld McleanVishal Patel, MD    Allergies  Allergen Reactions  . Codeine     REACTION: vomit  . Morphine And Related Other (See Comments)    "makes me crazy"    FAMILY HISTORY:  family history is not on file. SOCIAL HISTORY:  reports that she quit smoking about 6 years ago. Her smoking use included Cigarettes. She has a 200 pack-year smoking history. She does not have any smokeless tobacco history on file. She reports that she does not drink alcohol or use illicit drugs.  REVIEW OF SYSTEMS:   All negative; except for those that are bolded, which indicate positives.  Constitutional: weight loss, weight gain, night sweats, fevers, chills, fatigue, weakness.  HEENT: headaches, sore throat, sneezing, nasal congestion, post nasal drip, difficulty swallowing, tooth/dental problems, visual complaints, visual changes, ear aches. Neuro: difficulty with speech, weakness, numbness, ataxia. CV:  chest pain, orthopnea, PND, swelling in lower extremities, dizziness, palpitations, syncope.  Resp: cough (chronic), hemoptysis, dyspnea, wheezing. GI  heartburn, indigestion, abdominal pain, nausea, vomiting, diarrhea, constipation, change in bowel habits, loss of appetite, hematemesis, melena, hematochezia.  GU: dysuria, change in color of urine, urgency or frequency, flank pain, hematuria. MSK: joint pain or swelling, decreased range of motion. Psych: change in mood or affect, depression, anxiety, suicidal ideations, homicidal ideations. Skin: rash, itching, bruising.   SUBJECTIVE:  She reports that her breathing is actually much better than it was when she first came in.  She doesn't feel as if she is gasping any more.  She continues to have cough with occasional sputum production which she states is chronic.  Denies fevers/chills/sweats, chest pain, N/V/D, myalgias. She is interested in palliative care consult as she is frustrated with how much she has required hospitalization.  VITAL SIGNS: Temp:  [98.3 F (36.8 C)-98.8 F  (37.1 C)] 98.8 F (37.1 C) (12/05 34740608) Pulse Rate:  [86-95] 91 (12/05 0813) Resp:  [18-24] 18 (12/05 0733) BP: (141-198)/(58-120) 159/84 mmHg (12/05 0813) SpO2:  [95 %-100 %] 100 % (12/05 0733)  PHYSICAL EXAMINATION: General: Chronically ill appearing female, resting in bed, in NAD. Neuro: A&O x 3, non-focal.  HEENT: [place]/AT. PERRL, sclerae anicteric. Cardiovascular: RRR, no M/R/G.  Lungs: Respirations even and unlabored.  BS diminished bilaterally.  No W/R/R. Abdomen: BS x 4, soft, NT/ND.  Musculoskeletal: No gross deformities, no edema.  Skin: Intact, warm, no rashes.     Recent Labs Lab 03/12/15 0558 03/13/15 0534 03/14/15 0856  NA 137 136 135  K 5.2* 4.8 4.7  CL 103 101 96*  CO2 25 28 31   BUN 33* 23* 22*  CREATININE 0.81 0.75 0.81  GLUCOSE 137* 118* 111*    Recent Labs Lab 03/12/15 0558 03/13/15 0534 03/14/15 0856  HGB 11.0* 10.0* 9.7*  HCT 35.7* 32.8* 30.3*  WBC 31.7* 18.8* 12.9*  PLT 452* 396 334   Ct Angio Chest Pe W/cm &/or Wo Cm  03/12/2015  CLINICAL DATA:  64 year old female EXAM: CT ANGIOGRAPHY CHEST WITH CONTRAST TECHNIQUE: Multidetector CT imaging of the chest was performed using the standard protocol during bolus administration of intravenous contrast. Multiplanar CT image reconstructions and MIPs were obtained to evaluate the vascular anatomy. CONTRAST:  80mL OMNIPAQUE IOHEXOL 350 MG/ML SOLN COMPARISON:  Chest radiograph dated 03/11/2015 and CT dated 08/15/2007 FINDINGS: There is emphysematous changes of the lungs. Small airspace opacity predominantly involving the right lower lobe are concerning for developing pneumonia. Underlying mass is less likely but not excluded. Clinical correlation and follow-up to resolution is recommended. There is no pleural effusion or pneumothorax. Areas of subpleural reticular densities in the upper lobes may be related to chronic changes and scarring or represent pneumonia. The central airways are patent. There is mild  atherosclerotic calcification of the thoracic aorta. There is prominence of the main pulmonary trunk indicative of a degree of pulmonary hypertension. No CT evidence of pulmonary embolism. There is right hilar adenopathy. No mediastinal adenopathy. Mild cardiomegaly. No pericardial effusion. There is coronary vascular calcification. The visualized esophagus and thyroid gland are grossly unremarkable. There is mild diffuse subcutaneous soft tissue stranding of the chest wall. There is degenerative changes of the spine. No acute fracture. The visualized upper abdomen is grossly unremarkable. There is age indeterminate compression fracture of the superior endplate of the L1 vertebra with approximately 10% loss of vertebral body height. Clinical correlation is recommended. Review of the MIP images confirms the above findings. IMPRESSION: No CT evidence of pulmonary embolism. A small scattered patchy airspace opacity predominantly involving the right lower lobe most compatible with pneumonia. Clinical correlation and follow-up recommended. Electronically Signed   By: Elgie Collard M.D.   On: 03/12/2015 23:06    ASSESSMENT / PLAN:  Acute on chronic hypoxic respiratory failure - pt on 3-4L O2 as outpatient. AECOPD. HCAP. DO NOT INTUBATE STATUS. Plan: Continue supplemental O2 as needed to maintain SpO2 > 88%. DuoNebs / Albuterol. Continue steroids, abx. Follow cultures. Pulmonary hygiene. Palliative care consult. Follow up with pulmonology as outpatient (she already sees one in [place]). CXR intermittently.  Rest per primary team.   Rutherford Guys, PA - C [facility] Pager: [phone]  or [phone] 03/14/2015, 10:52 AM

## 2015-03-14 NOTE — Care Management Note (Signed)
Case Management Note  Patient Details  Name: Diamond Glass MRN: [id number] Date of Birth: [birth year]  Subjective/Objective:                 Patient admitted with PNA, sepsis, acute on chronic respiratory failure. Patient is 4L oxygen dependent at home. Patient will continue IV Abx. Patient is ES COPD, will require hospice care after discharge. Choices provided to daughter [name] and patient, [facility].    Action/Plan:  Patient to DC to home Hospice Care. Ref[name].  Expected Discharge Date:                  Expected Discharge Plan:  Home w Hospice Care  In-House Referral:     Discharge planning Services  CM Consult  Post Acute Care Choice:    Choice offered to:     DME Arranged:    DME Agency:     HH Arranged:    HH Agency:     Status of Service:  Completed, signed off  Medicare Important Message Given:  Yes Date Medicare IM Given:    Medicare IM give by:    Date Additional Medicare IM Given:    Additional Medicare Important Message give by:     If discussed at Long Length of Stay Meetings, dates discussed:    Additional Comments:  Lawerance SabalDebbie Kyia Rhude, RN 03/14/2015, 2:22 PM

## 2015-03-14 NOTE — Progress Notes (Signed)
   03/14/15 1530  Clinical Encounter Type  Visited With Patient  Visit Type Initial  Referral From Patient  Spiritual Encounters  Spiritual Needs Emotional;Literature  Stress Factors  Patient Stress Factors Family relationships   Chaplain responded to a patient's request to do an advanced directive. Chaplain left information with the patient. Chaplain also offered some support and empathic listening. Our support is available as needed.   Alda PonderAdam M Shauntay Brunelli, Chaplain 03/14/2015 4:00 PM

## 2015-03-15 DIAGNOSIS — J189 Pneumonia, unspecified organism: Secondary | ICD-10-CM

## 2015-03-15 LAB — CBC
HCT: 30.1 % — ABNORMAL LOW (ref 36.0–46.0)
HEMOGLOBIN: 9.5 g/dL — AB (ref 12.0–15.0)
MCH: 29.4 pg (ref 26.0–34.0)
MCHC: 31.6 g/dL (ref 30.0–36.0)
MCV: 93.2 fL (ref 78.0–100.0)
Platelets: 332 10*3/uL (ref 150–400)
RBC: 3.23 MIL/uL — ABNORMAL LOW (ref 3.87–5.11)
RDW: 12.7 % (ref 11.5–15.5)
WBC: 12.4 10*3/uL — ABNORMAL HIGH (ref 4.0–10.5)

## 2015-03-15 LAB — BASIC METABOLIC PANEL
ANION GAP: 7 (ref 5–15)
BUN: 19 mg/dL (ref 6–20)
CALCIUM: 9.5 mg/dL (ref 8.9–10.3)
CO2: 33 mmol/L — AB (ref 22–32)
CREATININE: 0.67 mg/dL (ref 0.44–1.00)
Chloride: 97 mmol/L — ABNORMAL LOW (ref 101–111)
GFR calc Af Amer: >60 mL/min (ref >60)
GLUCOSE: 115 mg/dL — AB (ref 65–99)
Potassium: 4.6 mmol/L (ref 3.5–5.1)
Sodium: 137 mmol/L (ref 135–145)

## 2015-03-15 LAB — CULTURE, BLOOD (ROUTINE X 2)
CULTURE: NO GROWTH
Culture: NO GROWTH

## 2015-03-15 LAB — GLUCOSE, CAPILLARY
GLUCOSE-CAPILLARY: 109 mg/dL — AB (ref 65–99)
GLUCOSE-CAPILLARY: 90 mg/dL (ref 65–99)
GLUCOSE-CAPILLARY: 110 mg/dL — AB (ref 65–99)
Glucose-Capillary: 133 mg/dL — ABNORMAL HIGH (ref 65–99)

## 2015-03-15 MED ORDER — BUDESONIDE 0.25 MG/2ML IN SUSP
0.5000 mg | Freq: Two times a day (BID) | RESPIRATORY_TRACT | Status: DC
  Administered 2015-03-15 – 2015-03-16 (×3): 0.5 mg via RESPIRATORY_TRACT
  Filled 2015-03-15 (×4): qty 4

## 2015-03-15 NOTE — Progress Notes (Signed)
Name: Diamond Glass MRN: [id number] DOB: December 04, 1950    ADMISSION DATE:  03/10/2015 CONSULTATION DATE:  03/10/15  REFERRING MD :  Isidoro Donningai  CHIEF COMPLAINT:  Dyspnea  BRIEF PATIENT DESCRIPTION: 64 y.o. female  With severe COPD (PFT's from 2010 with FEV1 38% pred), [facility] due to worsening SOB.  She was admitted with AECOPD, HCAP, and AoC hypoxic respiratory failure.  PCCM called 12/5 due to persistent dyspnea.  SIGNIFICANT EVENTS  12/1  Admit. 12/5  PCCM consulted.  STUDIES:  CXR 12/2 > right basilar infiltrate superimposed on severe COPD. CTA chest 12/3 > no PE.  RLL PNA.   SUBJECTIVE:  Pt reports dyspnea improved, minimal secretions.  Asking about home regimen for rescue medications.     VITAL SIGNS: Temp:  [98 F (36.7 C)-98.7 F (37.1 C)] 98 F (36.7 C) (12/06 0607) Pulse Rate:  [85-97] 86 (12/06 0607) Resp:  [16-23] 20 (12/06 0607) BP: (151-155)/(62-92) 151/92 mmHg (12/06 0607) SpO2:  [96 %-100 %] 100 % (12/06 0719)  PHYSICAL EXAMINATION: General: Chronically ill appearing female, resting in bed, in NAD. Neuro: A&O x 3, non-focal.  HEENT: [place]/AT. PERRL, sclerae anicteric. Cardiovascular: RRR, no M/R/G.  Lungs: Prolonged exp phase, BS diminished bilaterally.  No W/R/R. Abdomen: BS x 4, soft, NT/ND.  Musculoskeletal: No gross deformities, no edema.  Skin: Intact, warm, no rashes.     Recent Labs Lab 03/13/15 0534 03/14/15 0856 03/15/15 0535  NA 136 135 137  K 4.8 4.7 4.6  CL 101 96* 97*  CO2 28 31 33*  BUN 23* 22* 19  CREATININE 0.75 0.81 0.67  GLUCOSE 118* 111* 115*    Recent Labs Lab 03/13/15 0534 03/14/15 0856 03/15/15 0535  HGB 10.0* 9.7* 9.5*  HCT 32.8* 30.3* 30.1*  WBC 18.8* 12.9* 12.4*  PLT 396 334 332   No results found.  ASSESSMENT / PLAN:  Acute on chronic hypoxic respiratory failure - pt on 4L O2 as outpatient at baseline. AECOPD. HCAP. DO NOT INTUBATE STATUS.  Plan: Continue supplemental O2 as needed to maintain SpO2 88  - 95%. Plan for d/c on budesonide 0.5 mg BID, Q6 DuoNebs,  Albuterol + Q4 PRN albuterol neb for rescue with albuterol MDI (for when away from home).  Pt having physical difficulty with inhaling symbicort / spiriva. Continue steroids, slow taper to off over 7-10 days post discharge Complete 5 days total abx Follow cultures. Pulmonary hygiene. Palliative care consult, appreciate input. Follow up with pulmonology as outpatient [place] CXR intermittently.  Rest per primary team.   PCCM will be available PRN.  Please call back if new needs arise.    Canary BrimBrandi Ollis, NP-C [facility] Pgr: [phone] or if no answer [phone] 03/15/2015, 9:50 AM  Attending Note:  I have examined patient, reviewed labs, studies and notes. I have discussed the case with B Ollis, and I agree with the data and plans as amended above. Agree with conversion of symbicort / spiriva to scheduled nebs. Appreciate Hospice assistance. Please call if we can help further.   Levy Pupaobert Oziel Beitler, MD, PhD 03/15/2015, 10:26 AM [facility] [phone] or if no answer [phone]

## 2015-03-15 NOTE — Progress Notes (Signed)
[facility]                                                                              Patient Demographics  Diamond Glass, is a 64 y.o. female, DOB - [birth year], UXL:[id number]  Admit date - 03/10/2015   Admitting Physician Eduard ClosArshad N Kakrakandy, MD  Outpatient Primary MD for the patient is HOUT, Lowanda FosterBRITTANY, PA-C  LOS - 5   Chief Complaint  Patient presents with  . Shortness of Breath       Brief HPI   Diamond Farmernnas N Kohles is a 64 y.o. female with a history of COPD on 4 L of oxygen at home, hypertension, anxiety and pneumonia presented to ED with worsening shortness of breath since a day prior to admission. On the morning of admission, she woke up with respiratory distress and EMS was called, O2 sats were 86% on 4 L with diminished breath sounds. She continues to smoke the occasional cigarette, otherwise denied any recent fevers or chills, chest pain, palpitations. She has been intubated in the past. Workup in the ED showed ABG pH of 7.33, PCO2 63.9, O2 149 on BiPAP, initial troponin negative, BNP 178, basic metabolic panel significant for potassium of 3.4 serum glucose 187 chloride 97, CBC with WBC 33.1 hemoglobin 11.7, lactic acid 1.99. chest x-ray revealed right base infiltrate superimposed on severe COPD aspiration. EKG with sinus tach borderline QT prolongation similar to previous.  Assessment & Plan    Principal Problem: Acute on chronic respiratory failure related to COPD exacerbation, CAP pneumonia, sepsis - No significant improvement, still tight and wheezy, continue bronchodilators, IV steroids and antibiotics  -Continue IV vancomycin and Zosyn, flutter valve, Chart review indicates in 2010 FEV1 38%  - Respiratory virus panel showed rhinovirus positive, blood cultures negative, urine strep antigen, urine legionella antigen negative - CT chest showed no PE, small scattered patchy density involving the right lower lobe compatible with pneumonia - Appreciate  palliative medicine and pulmonology recommendations. Per pulmonology, continue scheduled nebs. -  Patient is now agreeable to hospice at home. Overall feeling better today. Palliative medicine recommended low-dose OxyIR when necessary for dyspnea.  Active problems COPD exacerbation in the setting of nicotine abuse, chronic respiratory failure: History of severe COPD -She is on 4 L nasal cannula at home , continue management as #1  Hyperglycemia related to steroids  -Patient on chronic steroids low-dose at home  -use sliding scale insulin as indicated   Hypertension. : Somewhat elevated today -Continue lisinopril, Norvasc 5 mg daily, hydralazine as needed  Prolonged QT. Mild per EKG  -Current EKG similar to past  -No chest pain, roponin neg    Anemia. Normocytic. Likely related to chronic disease  -Current hemoglobin at baseline   Anxiety/depression. Chronic  -Stable and at baseline, continue Prozac for palliative medicine increase Xanax when necessary dose..     Tobacco use.  -cessation counseling provided  -patient declines nicoderm   Code Status: Partial  Family Communication: Discussed in detail with the patient, all imaging results, lab results explained to the patient   Disposition Plan:DC home in a.m. if continues to remain stable   Time Spent in  minutes   25 minutes  Procedures  CT chest  Consults   Pulmonology  DVT Prophylaxis  Lovenox   Medications  Scheduled Meds: . amLODipine  5 mg Oral Daily  . antiseptic oral rinse  7 mL Mouth Rinse BID  . budesonide  0.5 mg Nebulization BID  . enoxaparin (LOVENOX) injection  30 mg Subcutaneous Daily  . FLUoxetine  80 mg Oral Daily  . insulin aspart  0-20 Units Subcutaneous TID WC  . insulin aspart  0-5 Units Subcutaneous QHS  . ipratropium-albuterol  3 mL Nebulization Q4H  . lisinopril  10 mg Oral Daily  . meloxicam  15 mg Oral Daily  . methylPREDNISolone (SOLU-MEDROL) injection  60 mg Intravenous Q12H  .  piperacillin-tazobactam (ZOSYN)  IV  3.375 g Intravenous 3 times per day  . vancomycin  750 mg Intravenous Q12H   Continuous Infusions:  PRN Meds:.acetaminophen, albuterol, ALPRAZolam, hydrALAZINE, oxyCODONE   Antibiotics   Anti-infectives    Start     Dose/Rate Route Frequency Ordered Stop   03/13/15 2200  vancomycin (VANCOCIN) IVPB 750 mg/150 ml premix     750 mg 150 mL/hr over 60 Minutes Intravenous Every 12 hours 03/13/15 1240 03/16/15 2359   03/10/15 1800  vancomycin (VANCOCIN) 500 mg in sodium chloride 0.9 % 100 mL IVPB  Status:  Discontinued     500 mg 100 mL/hr over 60 Minutes Intravenous Every 12 hours 03/10/15 0745 03/13/15 1240   03/10/15 1400  piperacillin-tazobactam (ZOSYN) IVPB 3.375 g     3.375 g 12.5 mL/hr over 240 Minutes Intravenous 3 times per day 03/10/15 0745 03/16/15 2359   03/10/15 0430  vancomycin (VANCOCIN) IVPB 1000 mg/200 mL premix     1,000 mg 200 mL/hr over 60 Minutes Intravenous  Once 03/10/15 0428 03/10/15 0801   03/10/15 0430  piperacillin-tazobactam (ZOSYN) IVPB 3.375 g     3.375 g 12.5 mL/hr over 240 Minutes Intravenous  Once 03/10/15 0428 03/10/15 0554        Subjective:   [facility] was seen and examined today.Feeling a lot better today, less anxiety and shakiness. Headache is improved. Shortness of breath is improving.   Patient denies dizziness, abdominal pain, N/V/D/C, new weakness, numbess, tingling. No acute events overnight.    Objective:   Blood pressure 142/60, pulse 95, temperature 98 F (36.7 C), temperature source Oral, resp. rate 20, height 5\' 2"  (1.575 m), weight 56.7 kg (125 lb), SpO2 100 %.  Wt Readings from Last 3 Encounters:  03/10/15 56.7 kg (125 lb)  11/30/14 58.3 kg (128 lb 8.5 oz)  04/10/14 60.011 kg (132 lb 4.8 oz)     Intake/Output Summary (Last 24 hours) at 03/15/15 1309 Last data filed at 03/15/15 0800  Gross per 24 hour  Intake    890 ml  Output   1050 ml  Net   -160 ml    Exam  General: Alert and  oriented x 3, NAD, anxious  HEENT:  PERRLA, EOMI, Anicteric Sclera, mucous membranes moist.   Neck: Supple, no JVD, no masses  CVS: S1 S2 clear, RRR  Respiratory: Diminished breath sounds throughout  Abdomen: Soft, nontender, nondistended, + bowel sounds  Ext: no cyanosis clubbing or edema  Neuro: no new deficits  Skin: No rashes  Psych: Normal affect and demeanor, alert and oriented x3    Data Review   Micro Results Recent Results (from the past 240 hour(s))  Blood culture (routine x 2)     Status: None   Collection Time:  03/10/15  5:05 AM  Result Value Ref Range Status   Specimen Description BLOOD LEFT ANTECUBITAL  Final   Special Requests BOTTLES DRAWN AEROBIC AND ANAEROBIC 5CC  Final   Culture NO GROWTH 5 DAYS  Final   Report Status 03/15/2015 FINAL  Final  Blood culture (routine x 2)     Status: None   Collection Time: 03/10/15  5:11 AM  Result Value Ref Range Status   Specimen Description BLOOD HAND LEFT  Final   Special Requests BOTTLES DRAWN AEROBIC AND ANAEROBIC 5CC  Final   Culture NO GROWTH 5 DAYS  Final   Report Status 03/15/2015 FINAL  Final  Urine culture     Status: None   Collection Time: 03/10/15  1:34 PM  Result Value Ref Range Status   Specimen Description URINE, CLEAN CATCH  Final   Special Requests NONE  Final   Culture NO GROWTH 1 DAY  Final   Report Status 03/11/2015 FINAL  Final    Radiology Reports Ct Angio Chest Pe W/cm &/or Wo Cm  03/12/2015  CLINICAL DATA:  64 year old female EXAM: CT ANGIOGRAPHY CHEST WITH CONTRAST TECHNIQUE: Multidetector CT imaging of the chest was performed using the standard protocol during bolus administration of intravenous contrast. Multiplanar CT image reconstructions and MIPs were obtained to evaluate the vascular anatomy. CONTRAST:  80mL OMNIPAQUE IOHEXOL 350 MG/ML SOLN COMPARISON:  Chest radiograph dated 03/11/2015 and CT dated 08/15/2007 FINDINGS: There is emphysematous changes of the lungs. Small airspace  opacity predominantly involving the right lower lobe are concerning for developing pneumonia. Underlying mass is less likely but not excluded. Clinical correlation and follow-up to resolution is recommended. There is no pleural effusion or pneumothorax. Areas of subpleural reticular densities in the upper lobes may be related to chronic changes and scarring or represent pneumonia. The central airways are patent. There is mild atherosclerotic calcification of the thoracic aorta. There is prominence of the main pulmonary trunk indicative of a degree of pulmonary hypertension. No CT evidence of pulmonary embolism. There is right hilar adenopathy. No mediastinal adenopathy. Mild cardiomegaly. No pericardial effusion. There is coronary vascular calcification. The visualized esophagus and thyroid gland are grossly unremarkable. There is mild diffuse subcutaneous soft tissue stranding of the chest wall. There is degenerative changes of the spine. No acute fracture. The visualized upper abdomen is grossly unremarkable. There is age indeterminate compression fracture of the superior endplate of the L1 vertebra with approximately 10% loss of vertebral body height. Clinical correlation is recommended. Review of the MIP images confirms the above findings. IMPRESSION: No CT evidence of pulmonary embolism. A small scattered patchy airspace opacity predominantly involving the right lower lobe most compatible with pneumonia. Clinical correlation and follow-up recommended. Electronically Signed   By: Elgie Collard M.D.   On: 03/12/2015 23:06   Dg Chest Port 1 View  03/11/2015  CLINICAL DATA:  COPD. EXAM: PORTABLE CHEST 1 VIEW COMPARISON:  03/10/2015. FINDINGS: Mediastinum hilar structures normal. Cardiomegaly with pulmonary vascular prominence and interstitial prominence with bilateral effusions. Findings are worse of from prior exam. Findings consistent with congestive heart failure. Right base infiltrate is improved. No  pneumothorax. IMPRESSION: 1. Cardiomegaly with progressive pulmonary venous congestion, bilateral pulmonary interstitial prominence, and small pleural effusions consistent with congestive heart failure. 2.  Interim partial clearing of right base infiltrate. Electronically Signed   By: Maisie Fus  Register   On: 03/11/2015 07:52   Dg Chest Port 1 View  03/10/2015  CLINICAL DATA:  Dyspnea, onset tonight. EXAM: PORTABLE CHEST  1 VIEW COMPARISON:  11/30/2014 FINDINGS: There is patchy airspace opacity in the lateral right base, superimposed on marked hyperinflation and emphysematous changes. Fibrotic appearing interstitial coarsening is present. No large effusions. No pneumothorax. IMPRESSION: Right base infiltrate superimposed on severe COPD, likely pneumonia. Electronically Signed   By: Ellery Plunk M.D.   On: 03/10/2015 04:05    CBC  Recent Labs Lab 03/10/15 0427 03/11/15 0645 03/12/15 0558 03/13/15 0534 03/14/15 0856 03/15/15 0535  WBC 33.1* 23.8* 31.7* 18.8* 12.9* 12.4*  HGB 11.7* 9.5* 11.0* 10.0* 9.7* 9.5*  HCT 36.3 29.1* 35.7* 32.8* 30.3* 30.1*  PLT 377 269 452* 396 334 332  MCV 94.3 92.7 94.4 94.3 93.8 93.2  MCH 30.4 30.3 29.1 28.7 30.0 29.4  MCHC 32.2 32.6 30.8 30.5 32.0 31.6  RDW 12.8 12.8 13.0 13.1 12.8 12.7  LYMPHSABS 1.1  --   --  0.3* 0.5*  --   MONOABS 1.1*  --   --  0.3 0.5  --   EOSABS 0.1  --   --  0.0 0.0  --   BASOSABS 0.0  --   --  0.0 0.0  --     Chemistries   Recent Labs Lab 03/10/15 1849 03/11/15 0645 03/12/15 0558 03/13/15 0534 03/14/15 0856 03/15/15 0535  NA  --  140 137 136 135 137  K  --  5.1 5.2* 4.8 4.7 4.6  CL  --  104 103 101 96* 97*  CO2  --  33*  GLUCOSE  --  114* 137* 118* 111* 115*  BUN  --  23* 33* 23* 22* 19  CREATININE  --  0.72 0.81 0.75 0.81 0.67  CALCIUM  --  9.1 9.7 9.4 9.5 9.5  MG 2.0  --   --   --   --   --   AST  --  17  --  22  --   --   ALT  --  14  --  20  --   --   ALKPHOS  --  46  --  47  --   --   BILITOT   --  0.5  --  0.5  --   --    ------------------------------------------------------------------------------------------------------------------ estimated creatinine clearance is 54.7 mL/min (by C-G formula based on Cr of 0.67). ------------------------------------------------------------------------------------------------------------------ No results for input(s): HGBA1C in the last 72 hours. ------------------------------------------------------------------------------------------------------------------ No results for input(s): CHOL, HDL, LDLCALC, TRIG, CHOLHDL, LDLDIRECT in the last 72 hours. ------------------------------------------------------------------------------------------------------------------ No results for input(s): TSH, T4TOTAL, T3FREE, THYROIDAB in the last 72 hours.  Invalid input(s): FREET3 ------------------------------------------------------------------------------------------------------------------ No results for input(s): VITAMINB12, FOLATE, FERRITIN, TIBC, IRON, RETICCTPCT in the last 72 hours.  Coagulation profile  Recent Labs Lab 03/10/15 1341  INR 1.02    No results for input(s): DDIMER in the last 72 hours.  Cardiac Enzymes  Recent Labs Lab 03/10/15 1849  TROPONINI 0.17*   ------------------------------------------------------------------------------------------------------------------ Invalid input(s): POCBNP   Recent Labs  03/14/15 0809 03/14/15 1204 03/14/15 1704 03/14/15 2118 03/15/15 0734 03/15/15 1200  GLUCAP 96 111* 131* 141* 109* 90     Arcenia Scarbro M.D. [facility] 03/15/2015, 1:09 PM  Pager: [phone] Between 7am to 7pm - call Pager - [phone]  After 7pm go to [email or web address] - password [email or web address]  Call night coverage person covering after 7pm

## 2015-03-15 NOTE — Progress Notes (Signed)
Physical Therapy Treatment Patient Details Name: Diamond Glass MRN: [id number] DOB: October 12, 1950 Today's Date: 03/15/2015    History of Present Illness Patient is a 64 y/o female with hx of COPD, HTN, anxiety who presents with SOB and found to have acute on chronic respiratory failure and sepsis related to pneumonia and COPD exacerbation. CXR-bilateral pulmonary interstitial prominence, and small pleural effusions consistent with CHF.    PT Comments    Pt able to increase ambulation distance and pt subjectively reports feeling better with gait, and was very pleased.  Pt appreciative of PT services. Pt instructed in HEP and put into Exit[facility] for printout at d/c.  Spoke with nursing and recommend pt begin ambulating with nursing to bathroom rather than BSC.   Follow Up Recommendations  No PT follow up;Supervision - Intermittent     Equipment Recommendations  None recommended by PT    Recommendations for Other Services       Precautions / Restrictions Precautions Precautions: Fall Precaution Comments: monitor 02 Restrictions Weight Bearing Restrictions: No    Mobility  Bed Mobility Overal bed mobility: Modified Independent             General bed mobility comments: Able to get to EOb with MOD I with HOB elevated  Transfers Overall transfer level: Needs assistance Equipment used: None Transfers: Stand Pivot Transfers Sit to Stand: Supervision Stand pivot transfers: Supervision       General transfer comment: S with SPT to [facility]  Ambulation/Gait Ambulation/Gait assistance: Min guard Ambulation Distance (Feet): 132 Feet Assistive device: Rolling walker (2 wheeled) Gait Pattern/deviations: Decreased step length - left;Decreased step length - right Gait velocity: decreased Gait velocity interpretation: Below normal speed for age/gender General Gait Details: Pt able to ambulate without any rest breaks while on 4 L/min o2. O2 97-98% after gait   Stairs             Wheelchair Mobility    Modified Rankin (Stroke Patients Only)       Balance Overall balance assessment: Needs assistance   Sitting balance-Leahy Scale: Good     Standing balance support: During functional activity Standing balance-Leahy Scale: Fair                      Cognition Arousal/Alertness: Awake/alert Behavior During Therapy: WFL for tasks assessed/performed Overall Cognitive Status: Within Functional Limits for tasks assessed                      Exercises General Exercises - Lower Extremity Ankle Circles/Pumps: Both;10 reps;Seated Long Arc Quad: Strengthening;Both;10 reps;Seated Hip ABduction/ADduction: 10 reps;Both;Seated Hip Flexion/Marching: Both;10 reps;Seated    General Comments General comments (skin integrity, edema, etc.): HEP for therex put into Exit care for printout when she dc      Pertinent Vitals/Pain Pain Assessment: No/denies pain    Home Living                      Prior Function            PT Goals (current goals can now be found in the care plan section) Acute Rehab PT Goals Patient Stated Goal: to be able to breathe PT Goal Formulation: With patient Time For Goal Achievement: 03/25/15 Potential to Achieve Goals: Good Progress towards PT goals: Progressing toward goals    Frequency  Min 3X/week    PT Plan Current plan remains appropriate    Co-evaluation  End of Session Equipment Utilized During Treatment: Gait belt;Oxygen Activity Tolerance: Patient tolerated treatment well Patient left: in chair;with call bell/phone within reach;with chair alarm set     Time: 1013-1046 PT Time Calculation (min) (ACUTE ONLY): 33 min  Charges:  $Gait Training: 8-22 mins $Therapeutic Exercise: 8-22 mins                    G Codes:      Diamond Glass 03/15/2015, 11:15 AM

## 2015-03-15 NOTE — Progress Notes (Signed)
ANTIBIOTIC CONSULT NOTE - FOLLOW UP  Pharmacy Consult for Vanco/Zosyn Indication: PNA  Allergies  Allergen Reactions  . Codeine     REACTION: vomit  . Morphine And Related Other (See Comments)    "makes me crazy"   Labs:  Recent Labs  03/13/15 0534 03/14/15 0856 03/15/15 0535  WBC 18.8* 12.9* 12.4*  HGB 10.0* 9.7* 9.5*  PLT 396 334 332  CREATININE 0.75 0.81 0.67   Estimated Creatinine Clearance: 54.7 mL/min (by C-G formula based on Cr of 0.67).  Recent Labs  03/13/15 1023  VANCOTROUGH 10    Assessment: Admitted 03/10/2015 with SOB/PNA possible sepsis for Vancomycin and Zosyn.  ID: Vanco/Zosyn for R/O sepsis, COPD exacerbation/PNA, CAP, afeb, WBC WNL  Vanc 12/1>> --12/4: VT 10: dose increased 750/12h Zosyn 12/1>>   12/1 BCx x2 >>pending 12/1 UCx>>negative  CCM indicates to complete 5 days of therapy.   Goal of Therapy:  Vancomycin trough level 15-20 mcg/ml  Plan:  Continue Vanc 750mg  IV q12h Continue Zosyn 3.375g IV q8hr. Entered stop dates for completion of 7 days (03/16/15) - stop now?  Thank you Okey RegalLisa Sailor Haughn, PharmD [phone]  03/15/2015,11:27 AM

## 2015-03-16 DIAGNOSIS — Z515 Encounter for palliative care: Secondary | ICD-10-CM

## 2015-03-16 DIAGNOSIS — Z7189 Other specified counseling: Secondary | ICD-10-CM

## 2015-03-16 LAB — GLUCOSE, CAPILLARY
GLUCOSE-CAPILLARY: 106 mg/dL — AB (ref 65–99)
GLUCOSE-CAPILLARY: 156 mg/dL — AB (ref 65–99)

## 2015-03-16 LAB — CBC
HCT: 32.4 % — ABNORMAL LOW (ref 36.0–46.0)
HEMOGLOBIN: 9.8 g/dL — AB (ref 12.0–15.0)
MCH: 28.7 pg (ref 26.0–34.0)
MCHC: 30.2 g/dL (ref 30.0–36.0)
MCV: 95 fL (ref 78.0–100.0)
Platelets: 349 10*3/uL (ref 150–400)
RBC: 3.41 MIL/uL — AB (ref 3.87–5.11)
RDW: 12.7 % (ref 11.5–15.5)
WBC: 14.9 10*3/uL — AB (ref 4.0–10.5)

## 2015-03-16 LAB — BASIC METABOLIC PANEL
ANION GAP: 7 (ref 5–15)
BUN: 20 mg/dL (ref 6–20)
CO2: 37 mmol/L — ABNORMAL HIGH (ref 22–32)
Calcium: 9.4 mg/dL (ref 8.9–10.3)
Chloride: 92 mmol/L — ABNORMAL LOW (ref 101–111)
Creatinine, Ser: 0.82 mg/dL (ref 0.44–1.00)
Glucose, Bld: 117 mg/dL — ABNORMAL HIGH (ref 65–99)
POTASSIUM: 5.3 mmol/L — AB (ref 3.5–5.1)
SODIUM: 136 mmol/L (ref 135–145)

## 2015-03-16 MED ORDER — HYDRALAZINE HCL 25 MG PO TABS
25.0000 mg | ORAL_TABLET | Freq: Two times a day (BID) | ORAL | Status: DC
  Administered 2015-03-16: 25 mg via ORAL
  Filled 2015-03-16: qty 1

## 2015-03-16 MED ORDER — PREDNISONE 10 MG PO TABS: 10.0000 mg | ORAL_TABLET | Freq: Every day | ORAL | Status: AC

## 2015-03-16 MED ORDER — ALBUTEROL SULFATE (2.5 MG/3ML) 0.083% IN NEBU: 2.5000 mg | INHALATION_SOLUTION | RESPIRATORY_TRACT | Status: AC | PRN

## 2015-03-16 MED ORDER — PREDNISONE 10 MG PO TABS
ORAL_TABLET | ORAL | Status: AC
Start: 2015-03-17 — End: ?

## 2015-03-16 MED ORDER — ALBUTEROL SULFATE HFA 108 (90 BASE) MCG/ACT IN AERS: 1.0000 | INHALATION_SPRAY | Freq: Four times a day (QID) | RESPIRATORY_TRACT | Status: AC | PRN

## 2015-03-16 MED ORDER — PREDNISONE 50 MG PO TABS
60.0000 mg | ORAL_TABLET | Freq: Every day | ORAL | Status: DC
  Administered 2015-03-16: 60 mg via ORAL
  Filled 2015-03-16: qty 1

## 2015-03-16 MED ORDER — AMOXICILLIN-POT CLAVULANATE 875-125 MG PO TABS: 1.0000 | ORAL_TABLET | Freq: Two times a day (BID) | ORAL | Status: AC

## 2015-03-16 MED ORDER — OXYCODONE HCL 5 MG PO TABS: 5.0000 mg | ORAL_TABLET | ORAL | Status: AC | PRN

## 2015-03-16 MED ORDER — IPRATROPIUM-ALBUTEROL 0.5-2.5 (3) MG/3ML IN SOLN: 3.0000 mL | RESPIRATORY_TRACT | Status: AC

## 2015-03-16 MED ORDER — IOHEXOL 350 MG/ML SOLN
80.0000 mL | Freq: Once | INTRAVENOUS | Status: AC | PRN
  Administered 2015-03-12: 80 mL via INTRAVENOUS

## 2015-03-16 MED ORDER — HYDRALAZINE HCL 25 MG PO TABS: 25.0000 mg | ORAL_TABLET | Freq: Two times a day (BID) | ORAL | Status: AC

## 2015-03-16 MED ORDER — AMLODIPINE BESYLATE 10 MG PO TABS
10.0000 mg | ORAL_TABLET | Freq: Every day | ORAL | Status: DC
  Administered 2015-03-16: 10 mg via ORAL
  Filled 2015-03-16: qty 1

## 2015-03-16 MED ORDER — BUDESONIDE 0.25 MG/2ML IN SUSP: 0.5000 mg | Freq: Two times a day (BID) | RESPIRATORY_TRACT | Status: AC

## 2015-03-16 MED ORDER — AMLODIPINE BESYLATE 10 MG PO TABS: 10.0000 mg | ORAL_TABLET | Freq: Every day | ORAL | Status: AC

## 2015-03-16 MED ORDER — ALPRAZOLAM 1 MG PO TABS: 1.0000 mg | ORAL_TABLET | Freq: Three times a day (TID) | ORAL | Status: AC | PRN

## 2015-03-16 NOTE — Care Management Note (Signed)
Case Management Note  Patient Details  Name: Rush Farmernnas N Runquist MRN: [id number] Date of Birth: Aug 28, 1950  Subjective/Objective:                   Subjective/Objective: Patient admitted with PNA, sepsis, acute on chronic respiratory failure. Patient is 4L oxygen dependent at home. Patient will continue IV Abx. Patient is ES COPD, will require hospice care after discharge. Choices provided to daughter [name] and patient, [facility].    Action/Plan:  Patient to DC to home Hospice Care. [name]. 03-16-15 Hospice notified of patient discharge today. Per hospice patient has equipment at home and they will switch it out to their suppliers after patient is home.     Expected Discharge Date:                  Expected Discharge Plan:  Home w Hospice Care  In-House Referral:     Discharge planning Services  CM Consult  Post Acute Care Choice:    Choice offered to:     DME Arranged:    DME Agency:     HH Arranged:    HH Agency:     Status of Service:  Completed, signed off  Medicare Important Message Given:  Yes Date Medicare IM Given:    Medicare IM give by:    Date Additional Medicare IM Given:    Additional Medicare Important Message give by:     If discussed at Long Length of Stay Meetings, dates discussed:    Additional Comments:  Lawerance SabalDebbie Quamesha Mullet, RN 03/16/2015, 10:44 AM

## 2015-03-16 NOTE — Progress Notes (Signed)
Cassandria AngerAnnas N Fuson to be D/C'd Home per MD order.  Discussed with the patient and all questions fully answered.  VSS, Skin clean, dry and intact without evidence of skin break down, no evidence of skin tears noted. IV catheter discontinued intact. Site without signs and symptoms of complications. Dressing and pressure applied.  An After Visit Summary was printed and given to the patient. Patient received prescription.  D/c education completed with patient/family including follow up instructions, medication list, d/c activities limitations if indicated, with other d/c instructions as indicated by MD - patient able to verbalize understanding, all questions fully answered.   Patient instructed to return to ED, call 911, or call MD for any changes in condition.   Patient escorted via WC, and D/C home via private auto.  L'ESPERANCE, Wyeth Hoffer C 03/16/2015 12:36 PM

## 2015-03-16 NOTE — Discharge Summary (Signed)
Physician Discharge Summary   Patient ID: Diamond Glass MRN: [id number] DOB/AGE: [birth year] 64 y.o.  Admit date: 03/10/2015 Discharge date: 03/16/2015  Primary Care Physician:  Tanna FurryHOUT, BRITTANY, PA-C  Discharge Diagnoses:    . Sepsis (HCC) . Acute on chronic respiratory failure (HCC) . COPD exacerbation (HCC) . Essential hypertension . Community acquired pneumonia . Chronic anemia . Anxiety and depression . Prolonged QT interval . Hypokalemia . Hyperglycemia . Pneumonia  Consults:  Pulmonology, Dr. Delton CoombesByrum Pall[facility]  Recommendations for Outpatient Follow-up:  1. Hospice arranged for the patient for end-stage COPD at home 2. Patient recommended to follow-up with her pulmonologist.    DIET: Heart healthy diet    Allergies:   Allergies  Allergen Reactions  . Codeine     REACTION: vomit  . Morphine And Related Other (See Comments)    "makes me crazy"     DISCHARGE MEDICATIONS: Current Discharge Medication List    START taking these medications   Details  albuterol (PROVENTIL) (2.5 MG/3ML) 0.083% nebulizer solution Take 3 mLs (2.5 mg total) by nebulization every 3 (three) hours as needed for wheezing or shortness of breath. Qty: 75 mL, Refills: 12    amLODipine (NORVASC) 10 MG tablet Take 1 tablet (10 mg total) by mouth daily. Qty: 30 tablet, Refills: 3    amoxicillin-clavulanate (AUGMENTIN) 875-125 MG tablet Take 1 tablet by mouth 2 (two) times daily. X 10days Qty: 20 tablet, Refills: 0    budesonide (PULMICORT) 0.25 MG/2ML nebulizer solution Take 4 mLs (0.5 mg total) by nebulization 2 (two) times daily. Qty: 60 mL, Refills: 12    hydrALAZINE (APRESOLINE) 25 MG tablet Take 1 tablet (25 mg total) by mouth 2 (two) times daily. Qty: 60 tablet, Refills: 3    ipratropium-albuterol (DUONEB) 0.5-2.5 (3) MG/3ML SOLN Take 3 mLs by nebulization every 4 (four) hours. Qty: 360 mL, Refills: 11    oxyCODONE (OXY IR/ROXICODONE) 5 MG  immediate release tablet Take 1 tablet (5 mg total) by mouth every 4 (four) hours as needed (dyspnea). Qty: 30 tablet, Refills: 0      CONTINUE these medications which have CHANGED   Details  albuterol (PROVENTIL HFA;VENTOLIN HFA) 108 (90 BASE) MCG/ACT inhaler Inhale 1-2 puffs into the lungs every 6 (six) hours as needed for wheezing or shortness of breath (rescue inhaler). Qty: 1 Inhaler, Refills: 3    ALPRAZolam (XANAX) 1 MG tablet Take 1 tablet (1 mg total) by mouth 3 (three) times daily as needed for anxiety. Qty: 90 tablet, Refills: 0    !! predniSONE (DELTASONE) 10 MG tablet Prednisone dosing: Take  Prednisone 40mg  (4 tabs) x 3 days, then taper to 30mg  (3 tabs) x 3 days, then 20mg  (2 tabs) x 3days, then 10mg  (1 tab) x 3days, then OFF.  Dispense:  30 tabs, refills: None Qty: 30 tablet, Refills: 0    !! predniSONE (DELTASONE) 10 MG tablet Take 1 tablet (10 mg total) by mouth daily with breakfast. Please continue maintenance dose after the taper is finished.     !! - Potential duplicate medications found. Please discuss with provider.    CONTINUE these medications which have NOT CHANGED   Details  FLUoxetine (PROZAC) 40 MG capsule Take 80 mg by mouth daily.    meloxicam (MOBIC) 15 MG tablet Take 15 mg by mouth daily.      STOP taking these medications     budesonide-formoterol (SYMBICORT) 160-4.5 MCG/ACT inhaler      lisinopril (PRINIVIL,ZESTRIL) 10 MG tablet  tiotropium (SPIRIVA) 18 MCG inhalation capsule          Brief H and P: For complete details please refer to admission H and P, but in brief Diamond Glass is a 64 y.o. female with a history of COPD on 4 L of oxygen at home, hypertension, anxiety and pneumonia presented to ED with worsening shortness of breath since a day prior to admission. On the morning of admission, she woke up with respiratory distress and EMS was called, O2 sats were 86% on 4 L with diminished breath sounds. She continues to smoke the  occasional cigarette, otherwise denied any recent fevers or chills, chest pain, palpitations. She has been intubated in the past. Workup in the ED showed ABG pH of 7.33, PCO2 63.9, O2 149 on BiPAP, initial troponin negative, BNP 178, basic metabolic panel significant for potassium of 3.4 serum glucose 187 chloride 97, CBC with WBC 33.1 hemoglobin 11.7, lactic acid 1.99. chest x-ray revealed right base infiltrate superimposed on severe COPD aspiration. EKG with sinus tach borderline QT prolongation similar to previous.  Hospital Course:  Acute on chronic respiratory failure related to COPD exacerbation, CAP pneumonia, sepsis -Patient significantly improved, transitioned to Augmentin, scheduled bronchodilators and prednisone taper. - Chart review indicates in 2010 FEV1 38%  - Respiratory virus panel showed rhinovirus positive, blood cultures negative, urine strep antigen, urine legionella antigen negative - CT chest showed no PE, small scattered patchy density involving the right lower lobe compatible with pneumonia - palliative medicine and pulmonology were consulted. Per pulmonology, continue scheduled nebs and recommended to discontinue Symbicort and Spiriva. Added Pulmicort per conditions. - Patient is now agreeable to hospice at home. Palliative medicine recommended low-dose OxyIR when necessary for dyspnea. Hospice at home was arranged.  COPD exacerbation in the setting of nicotine abuse, chronic respiratory failure: History of severe COPD -She is on 4 L nasal cannula at home , continue management as #1  Hyperglycemia related to steroids  -Patient on chronic steroids low-dose at home. Patient was placed on sliding scale insulin while inpatient.    Hypertension. : Somewhat elevated today - Patient started on hydralazine and Norvasc, lisinopril was discontinued.  Prolonged QT. Mild per EKG  -Current EKG similar to past  -No chest pain, roponin neg   Anemia. Normocytic. Likely related  to chronic disease  -Current hemoglobin at baseline   Anxiety/depression. Chronic  -Stable and at baseline, continue Prozac. Per palliative medicine increase Xanax when necessary dose..   Tobacco use.  -cessation counseling provided  -patient declines nicoderm    Day of Discharge BP 128/66 mmHg  Pulse 90  Temp(Src) 97.7 F (36.5 C) (Oral)  Resp 20  Ht  (1.575 m)  Wt 56.7 kg (125 lb)  BMI 22.86 kg/m2  SpO2 98%  Physical Exam: General: Alert and awake oriented x3 not in any acute distress. HEENT: anicteric sclera, pupils reactive to light and accommodation CVS: S1-S2 clear no murmur rubs or gallops Chest: Diminished breath sounds, clear to auscultation bilaterally  Abdomen: soft nontender, nondistended, normal bowel sounds Extremities: no cyanosis, clubbing or edema noted bilaterally Neuro: Cranial nerves II-XII intact, no focal neurological deficits   The results of significant diagnostics from this hospitalization (including imaging, microbiology, ancillary and laboratory) are listed below for reference.    LAB RESULTS: Basic Metabolic Panel:  Recent Labs Lab 03/10/15 1849  03/15/15 0535 03/16/15 0550  NA  --   < > 137 136  K  --   < > 4.6 5.3*  CL  --   < > 97* 92*  CO2  --   < > 33* 37*  GLUCOSE  --   < > 115* 117*  BUN  --   < > 19 20  CREATININE  --   < > 0.67 0.82  CALCIUM  --   < > 9.5 9.4  MG 2.0  --   --   --   < > = values in this interval not displayed. Liver Function Tests:  Recent Labs Lab 03/11/15 0645 03/13/15 0534  AST 17 22  ALT 14 20  ALKPHOS 46 47  BILITOT 0.5 0.5  PROT 5.1* 5.9*  ALBUMIN 2.7* 3.1*   No results for input(s): LIPASE, AMYLASE in the last 168 hours. No results for input(s): AMMONIA in the last 168 hours. CBC:  Recent Labs Lab 03/14/15 0856 03/15/15 0535 03/16/15 0550  WBC 12.9* 12.4* 14.9*  NEUTROABS 11.9*  --   --   HGB 9.7* 9.5* 9.8*  HCT 30.3* 30.1* 32.4*  MCV 93.8 93.2 95.0  PLT 334 332 349    Cardiac Enzymes:  Recent Labs Lab 03/10/15 1849  TROPONINI 0.17*   BNP: Invalid input(s): POCBNP CBG:  Recent Labs Lab 03/15/15 2111 03/16/15 0801  GLUCAP 133* 106*    Significant Diagnostic Studies:  Dg Chest Port 1 View  03/11/2015  CLINICAL DATA:  COPD. EXAM: PORTABLE CHEST 1 VIEW COMPARISON:  03/10/2015. FINDINGS: Mediastinum hilar structures normal. Cardiomegaly with pulmonary vascular prominence and interstitial prominence with bilateral effusions. Findings are worse of from prior exam. Findings consistent with congestive heart failure. Right base infiltrate is improved. No pneumothorax. IMPRESSION: 1. Cardiomegaly with progressive pulmonary venous congestion, bilateral pulmonary interstitial prominence, and small pleural effusions consistent with congestive heart failure. 2.  Interim partial clearing of right base infiltrate. Electronically Signed   By: Maisie Fus  Register   On: 03/11/2015 07:52   Dg Chest Port 1 View  03/10/2015  CLINICAL DATA:  Dyspnea, onset tonight. EXAM: PORTABLE CHEST 1 VIEW COMPARISON:  11/30/2014 FINDINGS: There is patchy airspace opacity in the lateral right base, superimposed on marked hyperinflation and emphysematous changes. Fibrotic appearing interstitial coarsening is present. No large effusions. No pneumothorax. IMPRESSION: Right base infiltrate superimposed on severe COPD, likely pneumonia. Electronically Signed   By: Ellery Plunk M.D.   On: 03/10/2015 04:05    2D ECHO:   Disposition and Follow-up: Discharge Instructions    Diet - low sodium heart healthy    Complete by:  As directed      Discharge instructions    Complete by:  As directed   Please note all the medication changes. Please STOP spiriva and symbicort. Instead, you will need PULMICORT nebs and scheduled albuterol and atrovent every 4 hours. Use albuterol inhaler as rescue inhaler when away from home.     Increase activity slowly    Complete by:  As directed              DISPOSITION: Home with hospice   DISCHARGE FOLLOW-UP Follow-up Information    Follow up with HOUT, BRITTANY, PA-C. Schedule an appointment as soon as possible for a visit in 10 days.   Specialty:  Physician Assistant   Why:  for hospital follow-up   Contact information:   [facility]. [address] [place] [address] [phone]       Follow up with [facility].   Why:  Will follow up on day of discharge. Notified of DC 03-16-15 at  10:45.   Contact information:   [address] [place] [address] [phone]        Time spent on Discharge:   Signed:   Remedy Corporan M.D. [facility] 03/16/2015, 11:07 AM Pager: [phone]

## 2015-04-01 DIAGNOSIS — Z66 Do not resuscitate: Secondary | ICD-10-CM | POA: Diagnosis not present

## 2015-04-01 DIAGNOSIS — J918 Pleural effusion in other conditions classified elsewhere: Secondary | ICD-10-CM | POA: Diagnosis not present

## 2015-04-01 DIAGNOSIS — Z9981 Dependence on supplemental oxygen: Secondary | ICD-10-CM | POA: Diagnosis not present

## 2015-04-01 DIAGNOSIS — Z720 Tobacco use: Secondary | ICD-10-CM | POA: Diagnosis not present

## 2015-04-01 DIAGNOSIS — I272 Other secondary pulmonary hypertension: Secondary | ICD-10-CM | POA: Diagnosis not present

## 2015-04-01 DIAGNOSIS — R0902 Hypoxemia: Secondary | ICD-10-CM | POA: Diagnosis not present

## 2015-04-01 DIAGNOSIS — J309 Allergic rhinitis, unspecified: Secondary | ICD-10-CM | POA: Diagnosis not present

## 2015-04-01 DIAGNOSIS — J9611 Chronic respiratory failure with hypoxia: Secondary | ICD-10-CM | POA: Diagnosis not present

## 2015-04-01 DIAGNOSIS — J441 Chronic obstructive pulmonary disease with (acute) exacerbation: Secondary | ICD-10-CM | POA: Diagnosis not present

## 2015-04-01 DIAGNOSIS — R531 Weakness: Secondary | ICD-10-CM | POA: Diagnosis not present

## 2015-04-01 DIAGNOSIS — R5383 Other fatigue: Secondary | ICD-10-CM | POA: Diagnosis not present

## 2015-04-01 DIAGNOSIS — F419 Anxiety disorder, unspecified: Secondary | ICD-10-CM | POA: Diagnosis not present

## 2015-04-01 DIAGNOSIS — I2510 Atherosclerotic heart disease of native coronary artery without angina pectoris: Secondary | ICD-10-CM | POA: Diagnosis not present

## 2015-04-01 DIAGNOSIS — I10 Essential (primary) hypertension: Secondary | ICD-10-CM | POA: Diagnosis not present

## 2015-04-01 DIAGNOSIS — R0602 Shortness of breath: Secondary | ICD-10-CM | POA: Diagnosis not present

## 2015-04-01 DIAGNOSIS — F339 Major depressive disorder, recurrent, unspecified: Secondary | ICD-10-CM | POA: Diagnosis not present

## 2015-04-01 DIAGNOSIS — Z8701 Personal history of pneumonia (recurrent): Secondary | ICD-10-CM | POA: Diagnosis not present

## 2015-04-05 DIAGNOSIS — R5383 Other fatigue: Secondary | ICD-10-CM | POA: Diagnosis not present

## 2015-04-05 DIAGNOSIS — R531 Weakness: Secondary | ICD-10-CM | POA: Diagnosis not present

## 2015-04-05 DIAGNOSIS — R0902 Hypoxemia: Secondary | ICD-10-CM | POA: Diagnosis not present

## 2015-04-05 DIAGNOSIS — F419 Anxiety disorder, unspecified: Secondary | ICD-10-CM | POA: Diagnosis not present

## 2015-04-05 DIAGNOSIS — R0602 Shortness of breath: Secondary | ICD-10-CM | POA: Diagnosis not present

## 2015-04-05 DIAGNOSIS — J441 Chronic obstructive pulmonary disease with (acute) exacerbation: Secondary | ICD-10-CM | POA: Diagnosis not present

## 2015-04-06 DIAGNOSIS — J441 Chronic obstructive pulmonary disease with (acute) exacerbation: Secondary | ICD-10-CM | POA: Diagnosis not present

## 2015-04-06 DIAGNOSIS — R531 Weakness: Secondary | ICD-10-CM | POA: Diagnosis not present

## 2015-04-06 DIAGNOSIS — R5383 Other fatigue: Secondary | ICD-10-CM | POA: Diagnosis not present

## 2015-04-06 DIAGNOSIS — R0602 Shortness of breath: Secondary | ICD-10-CM | POA: Diagnosis not present

## 2015-04-06 DIAGNOSIS — F419 Anxiety disorder, unspecified: Secondary | ICD-10-CM | POA: Diagnosis not present

## 2015-04-06 DIAGNOSIS — R0902 Hypoxemia: Secondary | ICD-10-CM | POA: Diagnosis not present

## 2015-04-08 DIAGNOSIS — R5383 Other fatigue: Secondary | ICD-10-CM | POA: Diagnosis not present

## 2015-04-08 DIAGNOSIS — R0602 Shortness of breath: Secondary | ICD-10-CM | POA: Diagnosis not present

## 2015-04-08 DIAGNOSIS — F419 Anxiety disorder, unspecified: Secondary | ICD-10-CM | POA: Diagnosis not present

## 2015-04-08 DIAGNOSIS — R531 Weakness: Secondary | ICD-10-CM | POA: Diagnosis not present

## 2015-04-08 DIAGNOSIS — R0902 Hypoxemia: Secondary | ICD-10-CM | POA: Diagnosis not present

## 2015-04-08 DIAGNOSIS — J441 Chronic obstructive pulmonary disease with (acute) exacerbation: Secondary | ICD-10-CM | POA: Diagnosis not present

## 2015-04-10 DIAGNOSIS — R0902 Hypoxemia: Secondary | ICD-10-CM | POA: Diagnosis not present

## 2015-04-10 DIAGNOSIS — I272 Other secondary pulmonary hypertension: Secondary | ICD-10-CM | POA: Diagnosis not present

## 2015-04-10 DIAGNOSIS — Z66 Do not resuscitate: Secondary | ICD-10-CM | POA: Diagnosis not present

## 2015-04-10 DIAGNOSIS — Z9981 Dependence on supplemental oxygen: Secondary | ICD-10-CM | POA: Diagnosis not present

## 2015-04-10 DIAGNOSIS — J918 Pleural effusion in other conditions classified elsewhere: Secondary | ICD-10-CM | POA: Diagnosis not present

## 2015-04-10 DIAGNOSIS — J441 Chronic obstructive pulmonary disease with (acute) exacerbation: Secondary | ICD-10-CM | POA: Diagnosis not present

## 2015-04-10 DIAGNOSIS — R0602 Shortness of breath: Secondary | ICD-10-CM | POA: Diagnosis not present

## 2015-04-10 DIAGNOSIS — Z720 Tobacco use: Secondary | ICD-10-CM | POA: Diagnosis not present

## 2015-04-10 DIAGNOSIS — R531 Weakness: Secondary | ICD-10-CM | POA: Diagnosis not present

## 2015-04-10 DIAGNOSIS — F419 Anxiety disorder, unspecified: Secondary | ICD-10-CM | POA: Diagnosis not present

## 2015-04-10 DIAGNOSIS — J309 Allergic rhinitis, unspecified: Secondary | ICD-10-CM | POA: Diagnosis not present

## 2015-04-10 DIAGNOSIS — Z8701 Personal history of pneumonia (recurrent): Secondary | ICD-10-CM | POA: Diagnosis not present

## 2015-04-10 DIAGNOSIS — R5383 Other fatigue: Secondary | ICD-10-CM | POA: Diagnosis not present

## 2015-04-10 DIAGNOSIS — I2510 Atherosclerotic heart disease of native coronary artery without angina pectoris: Secondary | ICD-10-CM | POA: Diagnosis not present

## 2015-04-10 DIAGNOSIS — R05 Cough: Secondary | ICD-10-CM | POA: Diagnosis not present

## 2015-04-10 DIAGNOSIS — R64 Cachexia: Secondary | ICD-10-CM | POA: Diagnosis not present

## 2015-04-10 DIAGNOSIS — F339 Major depressive disorder, recurrent, unspecified: Secondary | ICD-10-CM | POA: Diagnosis not present

## 2015-04-10 DIAGNOSIS — I10 Essential (primary) hypertension: Secondary | ICD-10-CM | POA: Diagnosis not present

## 2015-04-10 DIAGNOSIS — J9611 Chronic respiratory failure with hypoxia: Secondary | ICD-10-CM | POA: Diagnosis not present

## 2015-04-10 DIAGNOSIS — Z7952 Long term (current) use of systemic steroids: Secondary | ICD-10-CM | POA: Diagnosis not present

## 2015-04-11 DIAGNOSIS — J441 Chronic obstructive pulmonary disease with (acute) exacerbation: Secondary | ICD-10-CM | POA: Diagnosis not present

## 2015-04-11 DIAGNOSIS — F419 Anxiety disorder, unspecified: Secondary | ICD-10-CM | POA: Diagnosis not present

## 2015-04-11 DIAGNOSIS — I272 Other secondary pulmonary hypertension: Secondary | ICD-10-CM | POA: Diagnosis not present

## 2015-04-11 DIAGNOSIS — I10 Essential (primary) hypertension: Secondary | ICD-10-CM | POA: Diagnosis not present

## 2015-04-11 DIAGNOSIS — I2510 Atherosclerotic heart disease of native coronary artery without angina pectoris: Secondary | ICD-10-CM | POA: Diagnosis not present

## 2015-04-11 DIAGNOSIS — F339 Major depressive disorder, recurrent, unspecified: Secondary | ICD-10-CM | POA: Diagnosis not present

## 2015-04-12 DIAGNOSIS — J441 Chronic obstructive pulmonary disease with (acute) exacerbation: Secondary | ICD-10-CM | POA: Diagnosis not present

## 2015-04-12 DIAGNOSIS — I272 Other secondary pulmonary hypertension: Secondary | ICD-10-CM | POA: Diagnosis not present

## 2015-04-12 DIAGNOSIS — F339 Major depressive disorder, recurrent, unspecified: Secondary | ICD-10-CM | POA: Diagnosis not present

## 2015-04-12 DIAGNOSIS — I10 Essential (primary) hypertension: Secondary | ICD-10-CM | POA: Diagnosis not present

## 2015-04-12 DIAGNOSIS — F419 Anxiety disorder, unspecified: Secondary | ICD-10-CM | POA: Diagnosis not present

## 2015-04-12 DIAGNOSIS — I2510 Atherosclerotic heart disease of native coronary artery without angina pectoris: Secondary | ICD-10-CM | POA: Diagnosis not present

## 2015-04-13 DIAGNOSIS — I10 Essential (primary) hypertension: Secondary | ICD-10-CM | POA: Diagnosis not present

## 2015-04-13 DIAGNOSIS — J441 Chronic obstructive pulmonary disease with (acute) exacerbation: Secondary | ICD-10-CM | POA: Diagnosis not present

## 2015-04-13 DIAGNOSIS — F339 Major depressive disorder, recurrent, unspecified: Secondary | ICD-10-CM | POA: Diagnosis not present

## 2015-04-13 DIAGNOSIS — I2510 Atherosclerotic heart disease of native coronary artery without angina pectoris: Secondary | ICD-10-CM | POA: Diagnosis not present

## 2015-04-13 DIAGNOSIS — F419 Anxiety disorder, unspecified: Secondary | ICD-10-CM | POA: Diagnosis not present

## 2015-04-13 DIAGNOSIS — I272 Other secondary pulmonary hypertension: Secondary | ICD-10-CM | POA: Diagnosis not present

## 2015-04-14 DIAGNOSIS — F419 Anxiety disorder, unspecified: Secondary | ICD-10-CM | POA: Diagnosis not present

## 2015-04-14 DIAGNOSIS — I272 Other secondary pulmonary hypertension: Secondary | ICD-10-CM | POA: Diagnosis not present

## 2015-04-14 DIAGNOSIS — J441 Chronic obstructive pulmonary disease with (acute) exacerbation: Secondary | ICD-10-CM | POA: Diagnosis not present

## 2015-04-14 DIAGNOSIS — I10 Essential (primary) hypertension: Secondary | ICD-10-CM | POA: Diagnosis not present

## 2015-04-14 DIAGNOSIS — I2510 Atherosclerotic heart disease of native coronary artery without angina pectoris: Secondary | ICD-10-CM | POA: Diagnosis not present

## 2015-04-14 DIAGNOSIS — F339 Major depressive disorder, recurrent, unspecified: Secondary | ICD-10-CM | POA: Diagnosis not present

## 2015-04-15 DIAGNOSIS — F419 Anxiety disorder, unspecified: Secondary | ICD-10-CM | POA: Diagnosis not present

## 2015-04-15 DIAGNOSIS — I272 Other secondary pulmonary hypertension: Secondary | ICD-10-CM | POA: Diagnosis not present

## 2015-04-15 DIAGNOSIS — I2510 Atherosclerotic heart disease of native coronary artery without angina pectoris: Secondary | ICD-10-CM | POA: Diagnosis not present

## 2015-04-15 DIAGNOSIS — J441 Chronic obstructive pulmonary disease with (acute) exacerbation: Secondary | ICD-10-CM | POA: Diagnosis not present

## 2015-04-15 DIAGNOSIS — F339 Major depressive disorder, recurrent, unspecified: Secondary | ICD-10-CM | POA: Diagnosis not present

## 2015-04-15 DIAGNOSIS — I10 Essential (primary) hypertension: Secondary | ICD-10-CM | POA: Diagnosis not present

## 2015-04-18 DIAGNOSIS — I2510 Atherosclerotic heart disease of native coronary artery without angina pectoris: Secondary | ICD-10-CM | POA: Diagnosis not present

## 2015-04-18 DIAGNOSIS — J441 Chronic obstructive pulmonary disease with (acute) exacerbation: Secondary | ICD-10-CM | POA: Diagnosis not present

## 2015-04-18 DIAGNOSIS — I272 Other secondary pulmonary hypertension: Secondary | ICD-10-CM | POA: Diagnosis not present

## 2015-04-18 DIAGNOSIS — I10 Essential (primary) hypertension: Secondary | ICD-10-CM | POA: Diagnosis not present

## 2015-04-18 DIAGNOSIS — F339 Major depressive disorder, recurrent, unspecified: Secondary | ICD-10-CM | POA: Diagnosis not present

## 2015-04-18 DIAGNOSIS — F419 Anxiety disorder, unspecified: Secondary | ICD-10-CM | POA: Diagnosis not present

## 2015-04-19 DIAGNOSIS — I272 Other secondary pulmonary hypertension: Secondary | ICD-10-CM | POA: Diagnosis not present

## 2015-04-19 DIAGNOSIS — F419 Anxiety disorder, unspecified: Secondary | ICD-10-CM | POA: Diagnosis not present

## 2015-04-19 DIAGNOSIS — I10 Essential (primary) hypertension: Secondary | ICD-10-CM | POA: Diagnosis not present

## 2015-04-19 DIAGNOSIS — I2510 Atherosclerotic heart disease of native coronary artery without angina pectoris: Secondary | ICD-10-CM | POA: Diagnosis not present

## 2015-04-19 DIAGNOSIS — F339 Major depressive disorder, recurrent, unspecified: Secondary | ICD-10-CM | POA: Diagnosis not present

## 2015-04-19 DIAGNOSIS — J441 Chronic obstructive pulmonary disease with (acute) exacerbation: Secondary | ICD-10-CM | POA: Diagnosis not present

## 2015-04-20 DIAGNOSIS — I10 Essential (primary) hypertension: Secondary | ICD-10-CM | POA: Diagnosis not present

## 2015-04-20 DIAGNOSIS — I272 Other secondary pulmonary hypertension: Secondary | ICD-10-CM | POA: Diagnosis not present

## 2015-04-20 DIAGNOSIS — I2510 Atherosclerotic heart disease of native coronary artery without angina pectoris: Secondary | ICD-10-CM | POA: Diagnosis not present

## 2015-04-20 DIAGNOSIS — J441 Chronic obstructive pulmonary disease with (acute) exacerbation: Secondary | ICD-10-CM | POA: Diagnosis not present

## 2015-04-20 DIAGNOSIS — F419 Anxiety disorder, unspecified: Secondary | ICD-10-CM | POA: Diagnosis not present

## 2015-04-20 DIAGNOSIS — F339 Major depressive disorder, recurrent, unspecified: Secondary | ICD-10-CM | POA: Diagnosis not present

## 2015-04-22 DIAGNOSIS — J441 Chronic obstructive pulmonary disease with (acute) exacerbation: Secondary | ICD-10-CM | POA: Diagnosis not present

## 2015-04-22 DIAGNOSIS — I272 Other secondary pulmonary hypertension: Secondary | ICD-10-CM | POA: Diagnosis not present

## 2015-04-22 DIAGNOSIS — I2510 Atherosclerotic heart disease of native coronary artery without angina pectoris: Secondary | ICD-10-CM | POA: Diagnosis not present

## 2015-04-22 DIAGNOSIS — F419 Anxiety disorder, unspecified: Secondary | ICD-10-CM | POA: Diagnosis not present

## 2015-04-22 DIAGNOSIS — F339 Major depressive disorder, recurrent, unspecified: Secondary | ICD-10-CM | POA: Diagnosis not present

## 2015-04-22 DIAGNOSIS — I10 Essential (primary) hypertension: Secondary | ICD-10-CM | POA: Diagnosis not present

## 2015-04-25 DIAGNOSIS — I272 Other secondary pulmonary hypertension: Secondary | ICD-10-CM | POA: Diagnosis not present

## 2015-04-25 DIAGNOSIS — I2510 Atherosclerotic heart disease of native coronary artery without angina pectoris: Secondary | ICD-10-CM | POA: Diagnosis not present

## 2015-04-25 DIAGNOSIS — I10 Essential (primary) hypertension: Secondary | ICD-10-CM | POA: Diagnosis not present

## 2015-04-25 DIAGNOSIS — J441 Chronic obstructive pulmonary disease with (acute) exacerbation: Secondary | ICD-10-CM | POA: Diagnosis not present

## 2015-04-25 DIAGNOSIS — F339 Major depressive disorder, recurrent, unspecified: Secondary | ICD-10-CM | POA: Diagnosis not present

## 2015-04-25 DIAGNOSIS — F419 Anxiety disorder, unspecified: Secondary | ICD-10-CM | POA: Diagnosis not present

## 2015-04-27 DIAGNOSIS — I10 Essential (primary) hypertension: Secondary | ICD-10-CM | POA: Diagnosis not present

## 2015-04-27 DIAGNOSIS — J441 Chronic obstructive pulmonary disease with (acute) exacerbation: Secondary | ICD-10-CM | POA: Diagnosis not present

## 2015-04-27 DIAGNOSIS — I272 Other secondary pulmonary hypertension: Secondary | ICD-10-CM | POA: Diagnosis not present

## 2015-04-27 DIAGNOSIS — F339 Major depressive disorder, recurrent, unspecified: Secondary | ICD-10-CM | POA: Diagnosis not present

## 2015-04-27 DIAGNOSIS — F419 Anxiety disorder, unspecified: Secondary | ICD-10-CM | POA: Diagnosis not present

## 2015-04-27 DIAGNOSIS — I2510 Atherosclerotic heart disease of native coronary artery without angina pectoris: Secondary | ICD-10-CM | POA: Diagnosis not present

## 2015-04-29 DIAGNOSIS — F339 Major depressive disorder, recurrent, unspecified: Secondary | ICD-10-CM | POA: Diagnosis not present

## 2015-04-29 DIAGNOSIS — I272 Other secondary pulmonary hypertension: Secondary | ICD-10-CM | POA: Diagnosis not present

## 2015-04-29 DIAGNOSIS — J441 Chronic obstructive pulmonary disease with (acute) exacerbation: Secondary | ICD-10-CM | POA: Diagnosis not present

## 2015-04-29 DIAGNOSIS — I2510 Atherosclerotic heart disease of native coronary artery without angina pectoris: Secondary | ICD-10-CM | POA: Diagnosis not present

## 2015-04-29 DIAGNOSIS — F419 Anxiety disorder, unspecified: Secondary | ICD-10-CM | POA: Diagnosis not present

## 2015-04-29 DIAGNOSIS — I10 Essential (primary) hypertension: Secondary | ICD-10-CM | POA: Diagnosis not present

## 2015-05-02 DIAGNOSIS — I10 Essential (primary) hypertension: Secondary | ICD-10-CM | POA: Diagnosis not present

## 2015-05-02 DIAGNOSIS — J441 Chronic obstructive pulmonary disease with (acute) exacerbation: Secondary | ICD-10-CM | POA: Diagnosis not present

## 2015-05-02 DIAGNOSIS — I2510 Atherosclerotic heart disease of native coronary artery without angina pectoris: Secondary | ICD-10-CM | POA: Diagnosis not present

## 2015-05-02 DIAGNOSIS — I272 Other secondary pulmonary hypertension: Secondary | ICD-10-CM | POA: Diagnosis not present

## 2015-05-02 DIAGNOSIS — F419 Anxiety disorder, unspecified: Secondary | ICD-10-CM | POA: Diagnosis not present

## 2015-05-02 DIAGNOSIS — F339 Major depressive disorder, recurrent, unspecified: Secondary | ICD-10-CM | POA: Diagnosis not present

## 2015-05-03 DIAGNOSIS — I2510 Atherosclerotic heart disease of native coronary artery without angina pectoris: Secondary | ICD-10-CM | POA: Diagnosis not present

## 2015-05-03 DIAGNOSIS — I272 Other secondary pulmonary hypertension: Secondary | ICD-10-CM | POA: Diagnosis not present

## 2015-05-03 DIAGNOSIS — J441 Chronic obstructive pulmonary disease with (acute) exacerbation: Secondary | ICD-10-CM | POA: Diagnosis not present

## 2015-05-03 DIAGNOSIS — I10 Essential (primary) hypertension: Secondary | ICD-10-CM | POA: Diagnosis not present

## 2015-05-03 DIAGNOSIS — F339 Major depressive disorder, recurrent, unspecified: Secondary | ICD-10-CM | POA: Diagnosis not present

## 2015-05-03 DIAGNOSIS — F419 Anxiety disorder, unspecified: Secondary | ICD-10-CM | POA: Diagnosis not present

## 2015-05-05 DIAGNOSIS — F419 Anxiety disorder, unspecified: Secondary | ICD-10-CM | POA: Diagnosis not present

## 2015-05-05 DIAGNOSIS — I272 Other secondary pulmonary hypertension: Secondary | ICD-10-CM | POA: Diagnosis not present

## 2015-05-05 DIAGNOSIS — F339 Major depressive disorder, recurrent, unspecified: Secondary | ICD-10-CM | POA: Diagnosis not present

## 2015-05-05 DIAGNOSIS — J441 Chronic obstructive pulmonary disease with (acute) exacerbation: Secondary | ICD-10-CM | POA: Diagnosis not present

## 2015-05-05 DIAGNOSIS — I2510 Atherosclerotic heart disease of native coronary artery without angina pectoris: Secondary | ICD-10-CM | POA: Diagnosis not present

## 2015-05-05 DIAGNOSIS — I10 Essential (primary) hypertension: Secondary | ICD-10-CM | POA: Diagnosis not present

## 2015-05-10 DIAGNOSIS — I2510 Atherosclerotic heart disease of native coronary artery without angina pectoris: Secondary | ICD-10-CM | POA: Diagnosis not present

## 2015-05-10 DIAGNOSIS — F339 Major depressive disorder, recurrent, unspecified: Secondary | ICD-10-CM | POA: Diagnosis not present

## 2015-05-10 DIAGNOSIS — I10 Essential (primary) hypertension: Secondary | ICD-10-CM | POA: Diagnosis not present

## 2015-05-10 DIAGNOSIS — F419 Anxiety disorder, unspecified: Secondary | ICD-10-CM | POA: Diagnosis not present

## 2015-05-10 DIAGNOSIS — J441 Chronic obstructive pulmonary disease with (acute) exacerbation: Secondary | ICD-10-CM | POA: Diagnosis not present

## 2015-05-10 DIAGNOSIS — I272 Other secondary pulmonary hypertension: Secondary | ICD-10-CM | POA: Diagnosis not present

## 2015-05-11 DIAGNOSIS — Z9981 Dependence on supplemental oxygen: Secondary | ICD-10-CM | POA: Diagnosis not present

## 2015-05-11 DIAGNOSIS — Z7952 Long term (current) use of systemic steroids: Secondary | ICD-10-CM | POA: Diagnosis not present

## 2015-05-11 DIAGNOSIS — R64 Cachexia: Secondary | ICD-10-CM | POA: Diagnosis not present

## 2015-05-11 DIAGNOSIS — J441 Chronic obstructive pulmonary disease with (acute) exacerbation: Secondary | ICD-10-CM | POA: Diagnosis not present

## 2015-05-11 DIAGNOSIS — R0602 Shortness of breath: Secondary | ICD-10-CM | POA: Diagnosis not present

## 2015-05-11 DIAGNOSIS — Z66 Do not resuscitate: Secondary | ICD-10-CM | POA: Diagnosis not present

## 2015-05-11 DIAGNOSIS — I10 Essential (primary) hypertension: Secondary | ICD-10-CM | POA: Diagnosis not present

## 2015-05-11 DIAGNOSIS — R5383 Other fatigue: Secondary | ICD-10-CM | POA: Diagnosis not present

## 2015-05-11 DIAGNOSIS — F339 Major depressive disorder, recurrent, unspecified: Secondary | ICD-10-CM | POA: Diagnosis not present

## 2015-05-11 DIAGNOSIS — R0902 Hypoxemia: Secondary | ICD-10-CM | POA: Diagnosis not present

## 2015-05-11 DIAGNOSIS — J918 Pleural effusion in other conditions classified elsewhere: Secondary | ICD-10-CM | POA: Diagnosis not present

## 2015-05-11 DIAGNOSIS — I2510 Atherosclerotic heart disease of native coronary artery without angina pectoris: Secondary | ICD-10-CM | POA: Diagnosis not present

## 2015-05-11 DIAGNOSIS — I272 Other secondary pulmonary hypertension: Secondary | ICD-10-CM | POA: Diagnosis not present

## 2015-05-11 DIAGNOSIS — R531 Weakness: Secondary | ICD-10-CM | POA: Diagnosis not present

## 2015-05-11 DIAGNOSIS — J9611 Chronic respiratory failure with hypoxia: Secondary | ICD-10-CM | POA: Diagnosis not present

## 2015-05-11 DIAGNOSIS — F419 Anxiety disorder, unspecified: Secondary | ICD-10-CM | POA: Diagnosis not present

## 2015-05-11 DIAGNOSIS — J309 Allergic rhinitis, unspecified: Secondary | ICD-10-CM | POA: Diagnosis not present

## 2015-05-11 DIAGNOSIS — Z8701 Personal history of pneumonia (recurrent): Secondary | ICD-10-CM | POA: Diagnosis not present

## 2015-05-11 DIAGNOSIS — R05 Cough: Secondary | ICD-10-CM | POA: Diagnosis not present

## 2015-05-11 DIAGNOSIS — Z720 Tobacco use: Secondary | ICD-10-CM | POA: Diagnosis not present

## 2015-05-12 DIAGNOSIS — J441 Chronic obstructive pulmonary disease with (acute) exacerbation: Secondary | ICD-10-CM | POA: Diagnosis not present

## 2015-05-12 DIAGNOSIS — I2510 Atherosclerotic heart disease of native coronary artery without angina pectoris: Secondary | ICD-10-CM | POA: Diagnosis not present

## 2015-05-12 DIAGNOSIS — I10 Essential (primary) hypertension: Secondary | ICD-10-CM | POA: Diagnosis not present

## 2015-05-12 DIAGNOSIS — F339 Major depressive disorder, recurrent, unspecified: Secondary | ICD-10-CM | POA: Diagnosis not present

## 2015-05-12 DIAGNOSIS — I272 Other secondary pulmonary hypertension: Secondary | ICD-10-CM | POA: Diagnosis not present

## 2015-05-12 DIAGNOSIS — F419 Anxiety disorder, unspecified: Secondary | ICD-10-CM | POA: Diagnosis not present

## 2015-05-13 DIAGNOSIS — I272 Other secondary pulmonary hypertension: Secondary | ICD-10-CM | POA: Diagnosis not present

## 2015-05-13 DIAGNOSIS — J441 Chronic obstructive pulmonary disease with (acute) exacerbation: Secondary | ICD-10-CM | POA: Diagnosis not present

## 2015-05-13 DIAGNOSIS — F339 Major depressive disorder, recurrent, unspecified: Secondary | ICD-10-CM | POA: Diagnosis not present

## 2015-05-13 DIAGNOSIS — I10 Essential (primary) hypertension: Secondary | ICD-10-CM | POA: Diagnosis not present

## 2015-05-13 DIAGNOSIS — I2510 Atherosclerotic heart disease of native coronary artery without angina pectoris: Secondary | ICD-10-CM | POA: Diagnosis not present

## 2015-05-13 DIAGNOSIS — F419 Anxiety disorder, unspecified: Secondary | ICD-10-CM | POA: Diagnosis not present

## 2015-05-17 DIAGNOSIS — I2510 Atherosclerotic heart disease of native coronary artery without angina pectoris: Secondary | ICD-10-CM | POA: Diagnosis not present

## 2015-05-17 DIAGNOSIS — F419 Anxiety disorder, unspecified: Secondary | ICD-10-CM | POA: Diagnosis not present

## 2015-05-17 DIAGNOSIS — F339 Major depressive disorder, recurrent, unspecified: Secondary | ICD-10-CM | POA: Diagnosis not present

## 2015-05-17 DIAGNOSIS — J441 Chronic obstructive pulmonary disease with (acute) exacerbation: Secondary | ICD-10-CM | POA: Diagnosis not present

## 2015-05-17 DIAGNOSIS — I10 Essential (primary) hypertension: Secondary | ICD-10-CM | POA: Diagnosis not present

## 2015-05-17 DIAGNOSIS — I272 Other secondary pulmonary hypertension: Secondary | ICD-10-CM | POA: Diagnosis not present

## 2015-05-18 DIAGNOSIS — I272 Other secondary pulmonary hypertension: Secondary | ICD-10-CM | POA: Diagnosis not present

## 2015-05-18 DIAGNOSIS — I2510 Atherosclerotic heart disease of native coronary artery without angina pectoris: Secondary | ICD-10-CM | POA: Diagnosis not present

## 2015-05-18 DIAGNOSIS — F339 Major depressive disorder, recurrent, unspecified: Secondary | ICD-10-CM | POA: Diagnosis not present

## 2015-05-18 DIAGNOSIS — I10 Essential (primary) hypertension: Secondary | ICD-10-CM | POA: Diagnosis not present

## 2015-05-18 DIAGNOSIS — J441 Chronic obstructive pulmonary disease with (acute) exacerbation: Secondary | ICD-10-CM | POA: Diagnosis not present

## 2015-05-18 DIAGNOSIS — F419 Anxiety disorder, unspecified: Secondary | ICD-10-CM | POA: Diagnosis not present

## 2015-05-19 DIAGNOSIS — I2510 Atherosclerotic heart disease of native coronary artery without angina pectoris: Secondary | ICD-10-CM | POA: Diagnosis not present

## 2015-05-19 DIAGNOSIS — F339 Major depressive disorder, recurrent, unspecified: Secondary | ICD-10-CM | POA: Diagnosis not present

## 2015-05-19 DIAGNOSIS — F419 Anxiety disorder, unspecified: Secondary | ICD-10-CM | POA: Diagnosis not present

## 2015-05-19 DIAGNOSIS — I272 Other secondary pulmonary hypertension: Secondary | ICD-10-CM | POA: Diagnosis not present

## 2015-05-19 DIAGNOSIS — J441 Chronic obstructive pulmonary disease with (acute) exacerbation: Secondary | ICD-10-CM | POA: Diagnosis not present

## 2015-05-19 DIAGNOSIS — I10 Essential (primary) hypertension: Secondary | ICD-10-CM | POA: Diagnosis not present

## 2015-05-23 DIAGNOSIS — I272 Other secondary pulmonary hypertension: Secondary | ICD-10-CM | POA: Diagnosis not present

## 2015-05-23 DIAGNOSIS — F339 Major depressive disorder, recurrent, unspecified: Secondary | ICD-10-CM | POA: Diagnosis not present

## 2015-05-23 DIAGNOSIS — F419 Anxiety disorder, unspecified: Secondary | ICD-10-CM | POA: Diagnosis not present

## 2015-05-23 DIAGNOSIS — I2510 Atherosclerotic heart disease of native coronary artery without angina pectoris: Secondary | ICD-10-CM | POA: Diagnosis not present

## 2015-05-23 DIAGNOSIS — I10 Essential (primary) hypertension: Secondary | ICD-10-CM | POA: Diagnosis not present

## 2015-05-23 DIAGNOSIS — J441 Chronic obstructive pulmonary disease with (acute) exacerbation: Secondary | ICD-10-CM | POA: Diagnosis not present

## 2015-05-24 DIAGNOSIS — J441 Chronic obstructive pulmonary disease with (acute) exacerbation: Secondary | ICD-10-CM | POA: Diagnosis not present

## 2015-05-24 DIAGNOSIS — I2510 Atherosclerotic heart disease of native coronary artery without angina pectoris: Secondary | ICD-10-CM | POA: Diagnosis not present

## 2015-05-24 DIAGNOSIS — I272 Other secondary pulmonary hypertension: Secondary | ICD-10-CM | POA: Diagnosis not present

## 2015-05-24 DIAGNOSIS — I10 Essential (primary) hypertension: Secondary | ICD-10-CM | POA: Diagnosis not present

## 2015-05-24 DIAGNOSIS — F339 Major depressive disorder, recurrent, unspecified: Secondary | ICD-10-CM | POA: Diagnosis not present

## 2015-05-24 DIAGNOSIS — F419 Anxiety disorder, unspecified: Secondary | ICD-10-CM | POA: Diagnosis not present

## 2015-05-26 DIAGNOSIS — I272 Other secondary pulmonary hypertension: Secondary | ICD-10-CM | POA: Diagnosis not present

## 2015-05-26 DIAGNOSIS — F339 Major depressive disorder, recurrent, unspecified: Secondary | ICD-10-CM | POA: Diagnosis not present

## 2015-05-26 DIAGNOSIS — J441 Chronic obstructive pulmonary disease with (acute) exacerbation: Secondary | ICD-10-CM | POA: Diagnosis not present

## 2015-05-26 DIAGNOSIS — F419 Anxiety disorder, unspecified: Secondary | ICD-10-CM | POA: Diagnosis not present

## 2015-05-26 DIAGNOSIS — I10 Essential (primary) hypertension: Secondary | ICD-10-CM | POA: Diagnosis not present

## 2015-05-26 DIAGNOSIS — I2510 Atherosclerotic heart disease of native coronary artery without angina pectoris: Secondary | ICD-10-CM | POA: Diagnosis not present

## 2015-05-30 DIAGNOSIS — I2510 Atherosclerotic heart disease of native coronary artery without angina pectoris: Secondary | ICD-10-CM | POA: Diagnosis not present

## 2015-05-30 DIAGNOSIS — I10 Essential (primary) hypertension: Secondary | ICD-10-CM | POA: Diagnosis not present

## 2015-05-30 DIAGNOSIS — J441 Chronic obstructive pulmonary disease with (acute) exacerbation: Secondary | ICD-10-CM | POA: Diagnosis not present

## 2015-05-30 DIAGNOSIS — F419 Anxiety disorder, unspecified: Secondary | ICD-10-CM | POA: Diagnosis not present

## 2015-05-30 DIAGNOSIS — F339 Major depressive disorder, recurrent, unspecified: Secondary | ICD-10-CM | POA: Diagnosis not present

## 2015-05-30 DIAGNOSIS — I272 Other secondary pulmonary hypertension: Secondary | ICD-10-CM | POA: Diagnosis not present

## 2015-05-31 DIAGNOSIS — I272 Other secondary pulmonary hypertension: Secondary | ICD-10-CM | POA: Diagnosis not present

## 2015-05-31 DIAGNOSIS — F419 Anxiety disorder, unspecified: Secondary | ICD-10-CM | POA: Diagnosis not present

## 2015-05-31 DIAGNOSIS — F339 Major depressive disorder, recurrent, unspecified: Secondary | ICD-10-CM | POA: Diagnosis not present

## 2015-05-31 DIAGNOSIS — J441 Chronic obstructive pulmonary disease with (acute) exacerbation: Secondary | ICD-10-CM | POA: Diagnosis not present

## 2015-05-31 DIAGNOSIS — I2510 Atherosclerotic heart disease of native coronary artery without angina pectoris: Secondary | ICD-10-CM | POA: Diagnosis not present

## 2015-05-31 DIAGNOSIS — I10 Essential (primary) hypertension: Secondary | ICD-10-CM | POA: Diagnosis not present

## 2015-06-02 DIAGNOSIS — I272 Other secondary pulmonary hypertension: Secondary | ICD-10-CM | POA: Diagnosis not present

## 2015-06-02 DIAGNOSIS — F339 Major depressive disorder, recurrent, unspecified: Secondary | ICD-10-CM | POA: Diagnosis not present

## 2015-06-02 DIAGNOSIS — I2510 Atherosclerotic heart disease of native coronary artery without angina pectoris: Secondary | ICD-10-CM | POA: Diagnosis not present

## 2015-06-02 DIAGNOSIS — I10 Essential (primary) hypertension: Secondary | ICD-10-CM | POA: Diagnosis not present

## 2015-06-02 DIAGNOSIS — J441 Chronic obstructive pulmonary disease with (acute) exacerbation: Secondary | ICD-10-CM | POA: Diagnosis not present

## 2015-06-02 DIAGNOSIS — F419 Anxiety disorder, unspecified: Secondary | ICD-10-CM | POA: Diagnosis not present

## 2015-06-03 DIAGNOSIS — F339 Major depressive disorder, recurrent, unspecified: Secondary | ICD-10-CM | POA: Diagnosis not present

## 2015-06-03 DIAGNOSIS — I2510 Atherosclerotic heart disease of native coronary artery without angina pectoris: Secondary | ICD-10-CM | POA: Diagnosis not present

## 2015-06-03 DIAGNOSIS — I10 Essential (primary) hypertension: Secondary | ICD-10-CM | POA: Diagnosis not present

## 2015-06-03 DIAGNOSIS — F419 Anxiety disorder, unspecified: Secondary | ICD-10-CM | POA: Diagnosis not present

## 2015-06-03 DIAGNOSIS — I272 Other secondary pulmonary hypertension: Secondary | ICD-10-CM | POA: Diagnosis not present

## 2015-06-03 DIAGNOSIS — J441 Chronic obstructive pulmonary disease with (acute) exacerbation: Secondary | ICD-10-CM | POA: Diagnosis not present

## 2015-06-06 DIAGNOSIS — I2510 Atherosclerotic heart disease of native coronary artery without angina pectoris: Secondary | ICD-10-CM | POA: Diagnosis not present

## 2015-06-06 DIAGNOSIS — I272 Other secondary pulmonary hypertension: Secondary | ICD-10-CM | POA: Diagnosis not present

## 2015-06-06 DIAGNOSIS — J441 Chronic obstructive pulmonary disease with (acute) exacerbation: Secondary | ICD-10-CM | POA: Diagnosis not present

## 2015-06-06 DIAGNOSIS — I10 Essential (primary) hypertension: Secondary | ICD-10-CM | POA: Diagnosis not present

## 2015-06-06 DIAGNOSIS — F339 Major depressive disorder, recurrent, unspecified: Secondary | ICD-10-CM | POA: Diagnosis not present

## 2015-06-06 DIAGNOSIS — F419 Anxiety disorder, unspecified: Secondary | ICD-10-CM | POA: Diagnosis not present

## 2015-06-07 DIAGNOSIS — F339 Major depressive disorder, recurrent, unspecified: Secondary | ICD-10-CM | POA: Diagnosis not present

## 2015-06-07 DIAGNOSIS — F419 Anxiety disorder, unspecified: Secondary | ICD-10-CM | POA: Diagnosis not present

## 2015-06-07 DIAGNOSIS — J441 Chronic obstructive pulmonary disease with (acute) exacerbation: Secondary | ICD-10-CM | POA: Diagnosis not present

## 2015-06-07 DIAGNOSIS — I272 Other secondary pulmonary hypertension: Secondary | ICD-10-CM | POA: Diagnosis not present

## 2015-06-07 DIAGNOSIS — I10 Essential (primary) hypertension: Secondary | ICD-10-CM | POA: Diagnosis not present

## 2015-06-07 DIAGNOSIS — I2510 Atherosclerotic heart disease of native coronary artery without angina pectoris: Secondary | ICD-10-CM | POA: Diagnosis not present

## 2015-06-08 DIAGNOSIS — Z8701 Personal history of pneumonia (recurrent): Secondary | ICD-10-CM | POA: Diagnosis not present

## 2015-06-08 DIAGNOSIS — I10 Essential (primary) hypertension: Secondary | ICD-10-CM | POA: Diagnosis not present

## 2015-06-08 DIAGNOSIS — R05 Cough: Secondary | ICD-10-CM | POA: Diagnosis not present

## 2015-06-08 DIAGNOSIS — J9611 Chronic respiratory failure with hypoxia: Secondary | ICD-10-CM | POA: Diagnosis not present

## 2015-06-08 DIAGNOSIS — J918 Pleural effusion in other conditions classified elsewhere: Secondary | ICD-10-CM | POA: Diagnosis not present

## 2015-06-08 DIAGNOSIS — J309 Allergic rhinitis, unspecified: Secondary | ICD-10-CM | POA: Diagnosis not present

## 2015-06-08 DIAGNOSIS — Z7952 Long term (current) use of systemic steroids: Secondary | ICD-10-CM | POA: Diagnosis not present

## 2015-06-08 DIAGNOSIS — R0902 Hypoxemia: Secondary | ICD-10-CM | POA: Diagnosis not present

## 2015-06-08 DIAGNOSIS — F339 Major depressive disorder, recurrent, unspecified: Secondary | ICD-10-CM | POA: Diagnosis not present

## 2015-06-08 DIAGNOSIS — I2510 Atherosclerotic heart disease of native coronary artery without angina pectoris: Secondary | ICD-10-CM | POA: Diagnosis not present

## 2015-06-08 DIAGNOSIS — J441 Chronic obstructive pulmonary disease with (acute) exacerbation: Secondary | ICD-10-CM | POA: Diagnosis not present

## 2015-06-08 DIAGNOSIS — R0602 Shortness of breath: Secondary | ICD-10-CM | POA: Diagnosis not present

## 2015-06-08 DIAGNOSIS — Z9981 Dependence on supplemental oxygen: Secondary | ICD-10-CM | POA: Diagnosis not present

## 2015-06-08 DIAGNOSIS — F419 Anxiety disorder, unspecified: Secondary | ICD-10-CM | POA: Diagnosis not present

## 2015-06-08 DIAGNOSIS — R64 Cachexia: Secondary | ICD-10-CM | POA: Diagnosis not present

## 2015-06-08 DIAGNOSIS — Z66 Do not resuscitate: Secondary | ICD-10-CM | POA: Diagnosis not present

## 2015-06-08 DIAGNOSIS — I272 Other secondary pulmonary hypertension: Secondary | ICD-10-CM | POA: Diagnosis not present

## 2015-06-08 DIAGNOSIS — Z720 Tobacco use: Secondary | ICD-10-CM | POA: Diagnosis not present

## 2015-06-08 DIAGNOSIS — R5383 Other fatigue: Secondary | ICD-10-CM | POA: Diagnosis not present

## 2015-06-08 DIAGNOSIS — R531 Weakness: Secondary | ICD-10-CM | POA: Diagnosis not present

## 2015-06-09 DIAGNOSIS — J918 Pleural effusion in other conditions classified elsewhere: Secondary | ICD-10-CM | POA: Diagnosis not present

## 2015-06-09 DIAGNOSIS — F339 Major depressive disorder, recurrent, unspecified: Secondary | ICD-10-CM | POA: Diagnosis not present

## 2015-06-09 DIAGNOSIS — R64 Cachexia: Secondary | ICD-10-CM | POA: Diagnosis not present

## 2015-06-09 DIAGNOSIS — I2510 Atherosclerotic heart disease of native coronary artery without angina pectoris: Secondary | ICD-10-CM | POA: Diagnosis not present

## 2015-06-09 DIAGNOSIS — J441 Chronic obstructive pulmonary disease with (acute) exacerbation: Secondary | ICD-10-CM | POA: Diagnosis not present

## 2015-06-09 DIAGNOSIS — R0902 Hypoxemia: Secondary | ICD-10-CM | POA: Diagnosis not present

## 2015-06-13 DIAGNOSIS — R64 Cachexia: Secondary | ICD-10-CM | POA: Diagnosis not present

## 2015-06-13 DIAGNOSIS — I2510 Atherosclerotic heart disease of native coronary artery without angina pectoris: Secondary | ICD-10-CM | POA: Diagnosis not present

## 2015-06-13 DIAGNOSIS — F339 Major depressive disorder, recurrent, unspecified: Secondary | ICD-10-CM | POA: Diagnosis not present

## 2015-06-13 DIAGNOSIS — R0902 Hypoxemia: Secondary | ICD-10-CM | POA: Diagnosis not present

## 2015-06-13 DIAGNOSIS — J441 Chronic obstructive pulmonary disease with (acute) exacerbation: Secondary | ICD-10-CM | POA: Diagnosis not present

## 2015-06-13 DIAGNOSIS — J918 Pleural effusion in other conditions classified elsewhere: Secondary | ICD-10-CM | POA: Diagnosis not present

## 2015-06-14 DIAGNOSIS — I2510 Atherosclerotic heart disease of native coronary artery without angina pectoris: Secondary | ICD-10-CM | POA: Diagnosis not present

## 2015-06-14 DIAGNOSIS — R0902 Hypoxemia: Secondary | ICD-10-CM | POA: Diagnosis not present

## 2015-06-14 DIAGNOSIS — J441 Chronic obstructive pulmonary disease with (acute) exacerbation: Secondary | ICD-10-CM | POA: Diagnosis not present

## 2015-06-14 DIAGNOSIS — R64 Cachexia: Secondary | ICD-10-CM | POA: Diagnosis not present

## 2015-06-14 DIAGNOSIS — J918 Pleural effusion in other conditions classified elsewhere: Secondary | ICD-10-CM | POA: Diagnosis not present

## 2015-06-14 DIAGNOSIS — F339 Major depressive disorder, recurrent, unspecified: Secondary | ICD-10-CM | POA: Diagnosis not present

## 2015-06-16 DIAGNOSIS — F339 Major depressive disorder, recurrent, unspecified: Secondary | ICD-10-CM | POA: Diagnosis not present

## 2015-06-16 DIAGNOSIS — J441 Chronic obstructive pulmonary disease with (acute) exacerbation: Secondary | ICD-10-CM | POA: Diagnosis not present

## 2015-06-16 DIAGNOSIS — R64 Cachexia: Secondary | ICD-10-CM | POA: Diagnosis not present

## 2015-06-16 DIAGNOSIS — R0902 Hypoxemia: Secondary | ICD-10-CM | POA: Diagnosis not present

## 2015-06-16 DIAGNOSIS — J918 Pleural effusion in other conditions classified elsewhere: Secondary | ICD-10-CM | POA: Diagnosis not present

## 2015-06-16 DIAGNOSIS — I2510 Atherosclerotic heart disease of native coronary artery without angina pectoris: Secondary | ICD-10-CM | POA: Diagnosis not present

## 2015-06-17 DIAGNOSIS — I2510 Atherosclerotic heart disease of native coronary artery without angina pectoris: Secondary | ICD-10-CM | POA: Diagnosis not present

## 2015-06-17 DIAGNOSIS — J441 Chronic obstructive pulmonary disease with (acute) exacerbation: Secondary | ICD-10-CM | POA: Diagnosis not present

## 2015-06-17 DIAGNOSIS — R0902 Hypoxemia: Secondary | ICD-10-CM | POA: Diagnosis not present

## 2015-06-17 DIAGNOSIS — R64 Cachexia: Secondary | ICD-10-CM | POA: Diagnosis not present

## 2015-06-17 DIAGNOSIS — F339 Major depressive disorder, recurrent, unspecified: Secondary | ICD-10-CM | POA: Diagnosis not present

## 2015-06-17 DIAGNOSIS — J918 Pleural effusion in other conditions classified elsewhere: Secondary | ICD-10-CM | POA: Diagnosis not present

## 2015-06-20 DIAGNOSIS — J918 Pleural effusion in other conditions classified elsewhere: Secondary | ICD-10-CM | POA: Diagnosis not present

## 2015-06-20 DIAGNOSIS — J441 Chronic obstructive pulmonary disease with (acute) exacerbation: Secondary | ICD-10-CM | POA: Diagnosis not present

## 2015-06-20 DIAGNOSIS — R64 Cachexia: Secondary | ICD-10-CM | POA: Diagnosis not present

## 2015-06-20 DIAGNOSIS — R0902 Hypoxemia: Secondary | ICD-10-CM | POA: Diagnosis not present

## 2015-06-20 DIAGNOSIS — I2510 Atherosclerotic heart disease of native coronary artery without angina pectoris: Secondary | ICD-10-CM | POA: Diagnosis not present

## 2015-06-20 DIAGNOSIS — F339 Major depressive disorder, recurrent, unspecified: Secondary | ICD-10-CM | POA: Diagnosis not present

## 2015-06-23 DIAGNOSIS — J918 Pleural effusion in other conditions classified elsewhere: Secondary | ICD-10-CM | POA: Diagnosis not present

## 2015-06-23 DIAGNOSIS — R0902 Hypoxemia: Secondary | ICD-10-CM | POA: Diagnosis not present

## 2015-06-23 DIAGNOSIS — R64 Cachexia: Secondary | ICD-10-CM | POA: Diagnosis not present

## 2015-06-23 DIAGNOSIS — J441 Chronic obstructive pulmonary disease with (acute) exacerbation: Secondary | ICD-10-CM | POA: Diagnosis not present

## 2015-06-23 DIAGNOSIS — F339 Major depressive disorder, recurrent, unspecified: Secondary | ICD-10-CM | POA: Diagnosis not present

## 2015-06-23 DIAGNOSIS — I2510 Atherosclerotic heart disease of native coronary artery without angina pectoris: Secondary | ICD-10-CM | POA: Diagnosis not present

## 2015-06-28 DIAGNOSIS — R0902 Hypoxemia: Secondary | ICD-10-CM | POA: Diagnosis not present

## 2015-06-28 DIAGNOSIS — J441 Chronic obstructive pulmonary disease with (acute) exacerbation: Secondary | ICD-10-CM | POA: Diagnosis not present

## 2015-06-28 DIAGNOSIS — J918 Pleural effusion in other conditions classified elsewhere: Secondary | ICD-10-CM | POA: Diagnosis not present

## 2015-06-28 DIAGNOSIS — I2510 Atherosclerotic heart disease of native coronary artery without angina pectoris: Secondary | ICD-10-CM | POA: Diagnosis not present

## 2015-06-28 DIAGNOSIS — R64 Cachexia: Secondary | ICD-10-CM | POA: Diagnosis not present

## 2015-06-28 DIAGNOSIS — F339 Major depressive disorder, recurrent, unspecified: Secondary | ICD-10-CM | POA: Diagnosis not present

## 2015-06-29 DIAGNOSIS — I2510 Atherosclerotic heart disease of native coronary artery without angina pectoris: Secondary | ICD-10-CM | POA: Diagnosis not present

## 2015-06-29 DIAGNOSIS — J918 Pleural effusion in other conditions classified elsewhere: Secondary | ICD-10-CM | POA: Diagnosis not present

## 2015-06-29 DIAGNOSIS — J441 Chronic obstructive pulmonary disease with (acute) exacerbation: Secondary | ICD-10-CM | POA: Diagnosis not present

## 2015-06-29 DIAGNOSIS — F339 Major depressive disorder, recurrent, unspecified: Secondary | ICD-10-CM | POA: Diagnosis not present

## 2015-06-29 DIAGNOSIS — R0902 Hypoxemia: Secondary | ICD-10-CM | POA: Diagnosis not present

## 2015-06-29 DIAGNOSIS — R64 Cachexia: Secondary | ICD-10-CM | POA: Diagnosis not present

## 2015-06-30 DIAGNOSIS — I2510 Atherosclerotic heart disease of native coronary artery without angina pectoris: Secondary | ICD-10-CM | POA: Diagnosis not present

## 2015-06-30 DIAGNOSIS — F339 Major depressive disorder, recurrent, unspecified: Secondary | ICD-10-CM | POA: Diagnosis not present

## 2015-06-30 DIAGNOSIS — J441 Chronic obstructive pulmonary disease with (acute) exacerbation: Secondary | ICD-10-CM | POA: Diagnosis not present

## 2015-06-30 DIAGNOSIS — R64 Cachexia: Secondary | ICD-10-CM | POA: Diagnosis not present

## 2015-06-30 DIAGNOSIS — J918 Pleural effusion in other conditions classified elsewhere: Secondary | ICD-10-CM | POA: Diagnosis not present

## 2015-06-30 DIAGNOSIS — R0902 Hypoxemia: Secondary | ICD-10-CM | POA: Diagnosis not present

## 2015-07-01 DIAGNOSIS — F339 Major depressive disorder, recurrent, unspecified: Secondary | ICD-10-CM | POA: Diagnosis not present

## 2015-07-01 DIAGNOSIS — R0902 Hypoxemia: Secondary | ICD-10-CM | POA: Diagnosis not present

## 2015-07-01 DIAGNOSIS — J441 Chronic obstructive pulmonary disease with (acute) exacerbation: Secondary | ICD-10-CM | POA: Diagnosis not present

## 2015-07-01 DIAGNOSIS — J918 Pleural effusion in other conditions classified elsewhere: Secondary | ICD-10-CM | POA: Diagnosis not present

## 2015-07-01 DIAGNOSIS — R64 Cachexia: Secondary | ICD-10-CM | POA: Diagnosis not present

## 2015-07-01 DIAGNOSIS — I2510 Atherosclerotic heart disease of native coronary artery without angina pectoris: Secondary | ICD-10-CM | POA: Diagnosis not present

## 2015-07-05 DIAGNOSIS — J441 Chronic obstructive pulmonary disease with (acute) exacerbation: Secondary | ICD-10-CM | POA: Diagnosis not present

## 2015-07-05 DIAGNOSIS — I2510 Atherosclerotic heart disease of native coronary artery without angina pectoris: Secondary | ICD-10-CM | POA: Diagnosis not present

## 2015-07-05 DIAGNOSIS — R64 Cachexia: Secondary | ICD-10-CM | POA: Diagnosis not present

## 2015-07-05 DIAGNOSIS — F339 Major depressive disorder, recurrent, unspecified: Secondary | ICD-10-CM | POA: Diagnosis not present

## 2015-07-05 DIAGNOSIS — R0902 Hypoxemia: Secondary | ICD-10-CM | POA: Diagnosis not present

## 2015-07-05 DIAGNOSIS — J918 Pleural effusion in other conditions classified elsewhere: Secondary | ICD-10-CM | POA: Diagnosis not present

## 2015-07-07 DIAGNOSIS — J918 Pleural effusion in other conditions classified elsewhere: Secondary | ICD-10-CM | POA: Diagnosis not present

## 2015-07-07 DIAGNOSIS — R64 Cachexia: Secondary | ICD-10-CM | POA: Diagnosis not present

## 2015-07-07 DIAGNOSIS — F339 Major depressive disorder, recurrent, unspecified: Secondary | ICD-10-CM | POA: Diagnosis not present

## 2015-07-07 DIAGNOSIS — R0902 Hypoxemia: Secondary | ICD-10-CM | POA: Diagnosis not present

## 2015-07-07 DIAGNOSIS — J441 Chronic obstructive pulmonary disease with (acute) exacerbation: Secondary | ICD-10-CM | POA: Diagnosis not present

## 2015-07-07 DIAGNOSIS — I2510 Atherosclerotic heart disease of native coronary artery without angina pectoris: Secondary | ICD-10-CM | POA: Diagnosis not present

## 2015-07-09 DIAGNOSIS — R0602 Shortness of breath: Secondary | ICD-10-CM | POA: Diagnosis not present

## 2015-07-09 DIAGNOSIS — R531 Weakness: Secondary | ICD-10-CM | POA: Diagnosis not present

## 2015-07-09 DIAGNOSIS — R5383 Other fatigue: Secondary | ICD-10-CM | POA: Diagnosis not present

## 2015-07-09 DIAGNOSIS — I2510 Atherosclerotic heart disease of native coronary artery without angina pectoris: Secondary | ICD-10-CM | POA: Diagnosis not present

## 2015-07-09 DIAGNOSIS — R64 Cachexia: Secondary | ICD-10-CM | POA: Diagnosis not present

## 2015-07-09 DIAGNOSIS — Z66 Do not resuscitate: Secondary | ICD-10-CM | POA: Diagnosis not present

## 2015-07-09 DIAGNOSIS — J309 Allergic rhinitis, unspecified: Secondary | ICD-10-CM | POA: Diagnosis not present

## 2015-07-09 DIAGNOSIS — F339 Major depressive disorder, recurrent, unspecified: Secondary | ICD-10-CM | POA: Diagnosis not present

## 2015-07-09 DIAGNOSIS — I10 Essential (primary) hypertension: Secondary | ICD-10-CM | POA: Diagnosis not present

## 2015-07-09 DIAGNOSIS — J441 Chronic obstructive pulmonary disease with (acute) exacerbation: Secondary | ICD-10-CM | POA: Diagnosis not present

## 2015-07-09 DIAGNOSIS — Z720 Tobacco use: Secondary | ICD-10-CM | POA: Diagnosis not present

## 2015-07-09 DIAGNOSIS — Z7952 Long term (current) use of systemic steroids: Secondary | ICD-10-CM | POA: Diagnosis not present

## 2015-07-09 DIAGNOSIS — R05 Cough: Secondary | ICD-10-CM | POA: Diagnosis not present

## 2015-07-09 DIAGNOSIS — J9611 Chronic respiratory failure with hypoxia: Secondary | ICD-10-CM | POA: Diagnosis not present

## 2015-07-09 DIAGNOSIS — F419 Anxiety disorder, unspecified: Secondary | ICD-10-CM | POA: Diagnosis not present

## 2015-07-09 DIAGNOSIS — R0902 Hypoxemia: Secondary | ICD-10-CM | POA: Diagnosis not present

## 2015-07-09 DIAGNOSIS — Z8701 Personal history of pneumonia (recurrent): Secondary | ICD-10-CM | POA: Diagnosis not present

## 2015-07-09 DIAGNOSIS — J918 Pleural effusion in other conditions classified elsewhere: Secondary | ICD-10-CM | POA: Diagnosis not present

## 2015-07-09 DIAGNOSIS — I272 Other secondary pulmonary hypertension: Secondary | ICD-10-CM | POA: Diagnosis not present

## 2015-07-09 DIAGNOSIS — Z9981 Dependence on supplemental oxygen: Secondary | ICD-10-CM | POA: Diagnosis not present

## 2015-07-11 DIAGNOSIS — J918 Pleural effusion in other conditions classified elsewhere: Secondary | ICD-10-CM | POA: Diagnosis not present

## 2015-07-11 DIAGNOSIS — I2510 Atherosclerotic heart disease of native coronary artery without angina pectoris: Secondary | ICD-10-CM | POA: Diagnosis not present

## 2015-07-11 DIAGNOSIS — F339 Major depressive disorder, recurrent, unspecified: Secondary | ICD-10-CM | POA: Diagnosis not present

## 2015-07-11 DIAGNOSIS — R0902 Hypoxemia: Secondary | ICD-10-CM | POA: Diagnosis not present

## 2015-07-11 DIAGNOSIS — J441 Chronic obstructive pulmonary disease with (acute) exacerbation: Secondary | ICD-10-CM | POA: Diagnosis not present

## 2015-07-11 DIAGNOSIS — R64 Cachexia: Secondary | ICD-10-CM | POA: Diagnosis not present

## 2015-07-12 DIAGNOSIS — J918 Pleural effusion in other conditions classified elsewhere: Secondary | ICD-10-CM | POA: Diagnosis not present

## 2015-07-12 DIAGNOSIS — F339 Major depressive disorder, recurrent, unspecified: Secondary | ICD-10-CM | POA: Diagnosis not present

## 2015-07-12 DIAGNOSIS — J441 Chronic obstructive pulmonary disease with (acute) exacerbation: Secondary | ICD-10-CM | POA: Diagnosis not present

## 2015-07-12 DIAGNOSIS — R64 Cachexia: Secondary | ICD-10-CM | POA: Diagnosis not present

## 2015-07-12 DIAGNOSIS — R0902 Hypoxemia: Secondary | ICD-10-CM | POA: Diagnosis not present

## 2015-07-12 DIAGNOSIS — I2510 Atherosclerotic heart disease of native coronary artery without angina pectoris: Secondary | ICD-10-CM | POA: Diagnosis not present

## 2015-07-14 DIAGNOSIS — I2510 Atherosclerotic heart disease of native coronary artery without angina pectoris: Secondary | ICD-10-CM | POA: Diagnosis not present

## 2015-07-14 DIAGNOSIS — J918 Pleural effusion in other conditions classified elsewhere: Secondary | ICD-10-CM | POA: Diagnosis not present

## 2015-07-14 DIAGNOSIS — R64 Cachexia: Secondary | ICD-10-CM | POA: Diagnosis not present

## 2015-07-14 DIAGNOSIS — R0902 Hypoxemia: Secondary | ICD-10-CM | POA: Diagnosis not present

## 2015-07-14 DIAGNOSIS — F339 Major depressive disorder, recurrent, unspecified: Secondary | ICD-10-CM | POA: Diagnosis not present

## 2015-07-14 DIAGNOSIS — J441 Chronic obstructive pulmonary disease with (acute) exacerbation: Secondary | ICD-10-CM | POA: Diagnosis not present

## 2015-07-19 DIAGNOSIS — J441 Chronic obstructive pulmonary disease with (acute) exacerbation: Secondary | ICD-10-CM | POA: Diagnosis not present

## 2015-07-19 DIAGNOSIS — R0902 Hypoxemia: Secondary | ICD-10-CM | POA: Diagnosis not present

## 2015-07-19 DIAGNOSIS — R64 Cachexia: Secondary | ICD-10-CM | POA: Diagnosis not present

## 2015-07-19 DIAGNOSIS — F339 Major depressive disorder, recurrent, unspecified: Secondary | ICD-10-CM | POA: Diagnosis not present

## 2015-07-19 DIAGNOSIS — I2510 Atherosclerotic heart disease of native coronary artery without angina pectoris: Secondary | ICD-10-CM | POA: Diagnosis not present

## 2015-07-19 DIAGNOSIS — J918 Pleural effusion in other conditions classified elsewhere: Secondary | ICD-10-CM | POA: Diagnosis not present

## 2015-07-21 DIAGNOSIS — F339 Major depressive disorder, recurrent, unspecified: Secondary | ICD-10-CM | POA: Diagnosis not present

## 2015-07-21 DIAGNOSIS — R0902 Hypoxemia: Secondary | ICD-10-CM | POA: Diagnosis not present

## 2015-07-21 DIAGNOSIS — J918 Pleural effusion in other conditions classified elsewhere: Secondary | ICD-10-CM | POA: Diagnosis not present

## 2015-07-21 DIAGNOSIS — R64 Cachexia: Secondary | ICD-10-CM | POA: Diagnosis not present

## 2015-07-21 DIAGNOSIS — J441 Chronic obstructive pulmonary disease with (acute) exacerbation: Secondary | ICD-10-CM | POA: Diagnosis not present

## 2015-07-21 DIAGNOSIS — I2510 Atherosclerotic heart disease of native coronary artery without angina pectoris: Secondary | ICD-10-CM | POA: Diagnosis not present

## 2015-07-26 DIAGNOSIS — I2510 Atherosclerotic heart disease of native coronary artery without angina pectoris: Secondary | ICD-10-CM | POA: Diagnosis not present

## 2015-07-26 DIAGNOSIS — F339 Major depressive disorder, recurrent, unspecified: Secondary | ICD-10-CM | POA: Diagnosis not present

## 2015-07-26 DIAGNOSIS — J441 Chronic obstructive pulmonary disease with (acute) exacerbation: Secondary | ICD-10-CM | POA: Diagnosis not present

## 2015-07-26 DIAGNOSIS — J918 Pleural effusion in other conditions classified elsewhere: Secondary | ICD-10-CM | POA: Diagnosis not present

## 2015-07-26 DIAGNOSIS — R64 Cachexia: Secondary | ICD-10-CM | POA: Diagnosis not present

## 2015-07-26 DIAGNOSIS — R0902 Hypoxemia: Secondary | ICD-10-CM | POA: Diagnosis not present

## 2015-07-27 DIAGNOSIS — F339 Major depressive disorder, recurrent, unspecified: Secondary | ICD-10-CM | POA: Diagnosis not present

## 2015-07-27 DIAGNOSIS — R64 Cachexia: Secondary | ICD-10-CM | POA: Diagnosis not present

## 2015-07-27 DIAGNOSIS — J918 Pleural effusion in other conditions classified elsewhere: Secondary | ICD-10-CM | POA: Diagnosis not present

## 2015-07-27 DIAGNOSIS — R0902 Hypoxemia: Secondary | ICD-10-CM | POA: Diagnosis not present

## 2015-07-27 DIAGNOSIS — J441 Chronic obstructive pulmonary disease with (acute) exacerbation: Secondary | ICD-10-CM | POA: Diagnosis not present

## 2015-07-27 DIAGNOSIS — I2510 Atherosclerotic heart disease of native coronary artery without angina pectoris: Secondary | ICD-10-CM | POA: Diagnosis not present

## 2015-07-28 DIAGNOSIS — F339 Major depressive disorder, recurrent, unspecified: Secondary | ICD-10-CM | POA: Diagnosis not present

## 2015-07-28 DIAGNOSIS — J441 Chronic obstructive pulmonary disease with (acute) exacerbation: Secondary | ICD-10-CM | POA: Diagnosis not present

## 2015-07-28 DIAGNOSIS — R0902 Hypoxemia: Secondary | ICD-10-CM | POA: Diagnosis not present

## 2015-07-28 DIAGNOSIS — J918 Pleural effusion in other conditions classified elsewhere: Secondary | ICD-10-CM | POA: Diagnosis not present

## 2015-07-28 DIAGNOSIS — I2510 Atherosclerotic heart disease of native coronary artery without angina pectoris: Secondary | ICD-10-CM | POA: Diagnosis not present

## 2015-07-28 DIAGNOSIS — R64 Cachexia: Secondary | ICD-10-CM | POA: Diagnosis not present

## 2015-08-02 DIAGNOSIS — I2510 Atherosclerotic heart disease of native coronary artery without angina pectoris: Secondary | ICD-10-CM | POA: Diagnosis not present

## 2015-08-02 DIAGNOSIS — R0902 Hypoxemia: Secondary | ICD-10-CM | POA: Diagnosis not present

## 2015-08-02 DIAGNOSIS — F339 Major depressive disorder, recurrent, unspecified: Secondary | ICD-10-CM | POA: Diagnosis not present

## 2015-08-02 DIAGNOSIS — J441 Chronic obstructive pulmonary disease with (acute) exacerbation: Secondary | ICD-10-CM | POA: Diagnosis not present

## 2015-08-02 DIAGNOSIS — R64 Cachexia: Secondary | ICD-10-CM | POA: Diagnosis not present

## 2015-08-02 DIAGNOSIS — J918 Pleural effusion in other conditions classified elsewhere: Secondary | ICD-10-CM | POA: Diagnosis not present

## 2015-08-04 DIAGNOSIS — J918 Pleural effusion in other conditions classified elsewhere: Secondary | ICD-10-CM | POA: Diagnosis not present

## 2015-08-04 DIAGNOSIS — R0902 Hypoxemia: Secondary | ICD-10-CM | POA: Diagnosis not present

## 2015-08-04 DIAGNOSIS — J441 Chronic obstructive pulmonary disease with (acute) exacerbation: Secondary | ICD-10-CM | POA: Diagnosis not present

## 2015-08-04 DIAGNOSIS — R64 Cachexia: Secondary | ICD-10-CM | POA: Diagnosis not present

## 2015-08-04 DIAGNOSIS — I2510 Atherosclerotic heart disease of native coronary artery without angina pectoris: Secondary | ICD-10-CM | POA: Diagnosis not present

## 2015-08-04 DIAGNOSIS — F339 Major depressive disorder, recurrent, unspecified: Secondary | ICD-10-CM | POA: Diagnosis not present

## 2015-08-08 DIAGNOSIS — J441 Chronic obstructive pulmonary disease with (acute) exacerbation: Secondary | ICD-10-CM | POA: Diagnosis not present

## 2015-08-08 DIAGNOSIS — Z9119 Patient's noncompliance with other medical treatment and regimen: Secondary | ICD-10-CM | POA: Diagnosis not present

## 2015-08-08 DIAGNOSIS — I10 Essential (primary) hypertension: Secondary | ICD-10-CM | POA: Diagnosis not present

## 2015-08-08 DIAGNOSIS — I2510 Atherosclerotic heart disease of native coronary artery without angina pectoris: Secondary | ICD-10-CM | POA: Diagnosis not present

## 2015-08-08 DIAGNOSIS — I272 Other secondary pulmonary hypertension: Secondary | ICD-10-CM | POA: Diagnosis not present

## 2015-08-08 DIAGNOSIS — R0902 Hypoxemia: Secondary | ICD-10-CM | POA: Diagnosis not present

## 2015-08-08 DIAGNOSIS — F419 Anxiety disorder, unspecified: Secondary | ICD-10-CM | POA: Diagnosis not present

## 2015-08-08 DIAGNOSIS — R531 Weakness: Secondary | ICD-10-CM | POA: Diagnosis not present

## 2015-08-08 DIAGNOSIS — R0602 Shortness of breath: Secondary | ICD-10-CM | POA: Diagnosis not present

## 2015-08-08 DIAGNOSIS — Z7952 Long term (current) use of systemic steroids: Secondary | ICD-10-CM | POA: Diagnosis not present

## 2015-08-08 DIAGNOSIS — Z9981 Dependence on supplemental oxygen: Secondary | ICD-10-CM | POA: Diagnosis not present

## 2015-08-08 DIAGNOSIS — F339 Major depressive disorder, recurrent, unspecified: Secondary | ICD-10-CM | POA: Diagnosis not present

## 2015-08-08 DIAGNOSIS — J309 Allergic rhinitis, unspecified: Secondary | ICD-10-CM | POA: Diagnosis not present

## 2015-08-08 DIAGNOSIS — J918 Pleural effusion in other conditions classified elsewhere: Secondary | ICD-10-CM | POA: Diagnosis not present

## 2015-08-08 DIAGNOSIS — Z66 Do not resuscitate: Secondary | ICD-10-CM | POA: Diagnosis not present

## 2015-08-08 DIAGNOSIS — Z720 Tobacco use: Secondary | ICD-10-CM | POA: Diagnosis not present

## 2015-08-08 DIAGNOSIS — J9611 Chronic respiratory failure with hypoxia: Secondary | ICD-10-CM | POA: Diagnosis not present

## 2015-08-08 DIAGNOSIS — Z8701 Personal history of pneumonia (recurrent): Secondary | ICD-10-CM | POA: Diagnosis not present

## 2015-08-08 DIAGNOSIS — R05 Cough: Secondary | ICD-10-CM | POA: Diagnosis not present

## 2015-08-08 DIAGNOSIS — R64 Cachexia: Secondary | ICD-10-CM | POA: Diagnosis not present

## 2015-08-08 DIAGNOSIS — R5383 Other fatigue: Secondary | ICD-10-CM | POA: Diagnosis not present

## 2015-08-09 DIAGNOSIS — J918 Pleural effusion in other conditions classified elsewhere: Secondary | ICD-10-CM | POA: Diagnosis not present

## 2015-08-09 DIAGNOSIS — I2510 Atherosclerotic heart disease of native coronary artery without angina pectoris: Secondary | ICD-10-CM | POA: Diagnosis not present

## 2015-08-09 DIAGNOSIS — R0902 Hypoxemia: Secondary | ICD-10-CM | POA: Diagnosis not present

## 2015-08-09 DIAGNOSIS — R64 Cachexia: Secondary | ICD-10-CM | POA: Diagnosis not present

## 2015-08-09 DIAGNOSIS — F339 Major depressive disorder, recurrent, unspecified: Secondary | ICD-10-CM | POA: Diagnosis not present

## 2015-08-09 DIAGNOSIS — J441 Chronic obstructive pulmonary disease with (acute) exacerbation: Secondary | ICD-10-CM | POA: Diagnosis not present

## 2015-08-10 DIAGNOSIS — R64 Cachexia: Secondary | ICD-10-CM | POA: Diagnosis not present

## 2015-08-10 DIAGNOSIS — J441 Chronic obstructive pulmonary disease with (acute) exacerbation: Secondary | ICD-10-CM | POA: Diagnosis not present

## 2015-08-10 DIAGNOSIS — F339 Major depressive disorder, recurrent, unspecified: Secondary | ICD-10-CM | POA: Diagnosis not present

## 2015-08-10 DIAGNOSIS — J918 Pleural effusion in other conditions classified elsewhere: Secondary | ICD-10-CM | POA: Diagnosis not present

## 2015-08-10 DIAGNOSIS — R0902 Hypoxemia: Secondary | ICD-10-CM | POA: Diagnosis not present

## 2015-08-10 DIAGNOSIS — I2510 Atherosclerotic heart disease of native coronary artery without angina pectoris: Secondary | ICD-10-CM | POA: Diagnosis not present

## 2015-08-11 DIAGNOSIS — I2510 Atherosclerotic heart disease of native coronary artery without angina pectoris: Secondary | ICD-10-CM | POA: Diagnosis not present

## 2015-08-11 DIAGNOSIS — F339 Major depressive disorder, recurrent, unspecified: Secondary | ICD-10-CM | POA: Diagnosis not present

## 2015-08-11 DIAGNOSIS — R0902 Hypoxemia: Secondary | ICD-10-CM | POA: Diagnosis not present

## 2015-08-11 DIAGNOSIS — J918 Pleural effusion in other conditions classified elsewhere: Secondary | ICD-10-CM | POA: Diagnosis not present

## 2015-08-11 DIAGNOSIS — J441 Chronic obstructive pulmonary disease with (acute) exacerbation: Secondary | ICD-10-CM | POA: Diagnosis not present

## 2015-08-11 DIAGNOSIS — R64 Cachexia: Secondary | ICD-10-CM | POA: Diagnosis not present

## 2015-08-16 DIAGNOSIS — R64 Cachexia: Secondary | ICD-10-CM | POA: Diagnosis not present

## 2015-08-16 DIAGNOSIS — J918 Pleural effusion in other conditions classified elsewhere: Secondary | ICD-10-CM | POA: Diagnosis not present

## 2015-08-16 DIAGNOSIS — R0902 Hypoxemia: Secondary | ICD-10-CM | POA: Diagnosis not present

## 2015-08-16 DIAGNOSIS — I2510 Atherosclerotic heart disease of native coronary artery without angina pectoris: Secondary | ICD-10-CM | POA: Diagnosis not present

## 2015-08-16 DIAGNOSIS — J441 Chronic obstructive pulmonary disease with (acute) exacerbation: Secondary | ICD-10-CM | POA: Diagnosis not present

## 2015-08-16 DIAGNOSIS — F339 Major depressive disorder, recurrent, unspecified: Secondary | ICD-10-CM | POA: Diagnosis not present

## 2015-08-18 DIAGNOSIS — F339 Major depressive disorder, recurrent, unspecified: Secondary | ICD-10-CM | POA: Diagnosis not present

## 2015-08-18 DIAGNOSIS — R64 Cachexia: Secondary | ICD-10-CM | POA: Diagnosis not present

## 2015-08-18 DIAGNOSIS — J441 Chronic obstructive pulmonary disease with (acute) exacerbation: Secondary | ICD-10-CM | POA: Diagnosis not present

## 2015-08-18 DIAGNOSIS — I2510 Atherosclerotic heart disease of native coronary artery without angina pectoris: Secondary | ICD-10-CM | POA: Diagnosis not present

## 2015-08-18 DIAGNOSIS — J918 Pleural effusion in other conditions classified elsewhere: Secondary | ICD-10-CM | POA: Diagnosis not present

## 2015-08-18 DIAGNOSIS — R0902 Hypoxemia: Secondary | ICD-10-CM | POA: Diagnosis not present

## 2015-08-23 DIAGNOSIS — J918 Pleural effusion in other conditions classified elsewhere: Secondary | ICD-10-CM | POA: Diagnosis not present

## 2015-08-23 DIAGNOSIS — F339 Major depressive disorder, recurrent, unspecified: Secondary | ICD-10-CM | POA: Diagnosis not present

## 2015-08-23 DIAGNOSIS — R0902 Hypoxemia: Secondary | ICD-10-CM | POA: Diagnosis not present

## 2015-08-23 DIAGNOSIS — R64 Cachexia: Secondary | ICD-10-CM | POA: Diagnosis not present

## 2015-08-23 DIAGNOSIS — I2510 Atherosclerotic heart disease of native coronary artery without angina pectoris: Secondary | ICD-10-CM | POA: Diagnosis not present

## 2015-08-23 DIAGNOSIS — J441 Chronic obstructive pulmonary disease with (acute) exacerbation: Secondary | ICD-10-CM | POA: Diagnosis not present

## 2015-08-24 DIAGNOSIS — R64 Cachexia: Secondary | ICD-10-CM | POA: Diagnosis not present

## 2015-08-24 DIAGNOSIS — I2510 Atherosclerotic heart disease of native coronary artery without angina pectoris: Secondary | ICD-10-CM | POA: Diagnosis not present

## 2015-08-24 DIAGNOSIS — F339 Major depressive disorder, recurrent, unspecified: Secondary | ICD-10-CM | POA: Diagnosis not present

## 2015-08-24 DIAGNOSIS — J441 Chronic obstructive pulmonary disease with (acute) exacerbation: Secondary | ICD-10-CM | POA: Diagnosis not present

## 2015-08-24 DIAGNOSIS — J918 Pleural effusion in other conditions classified elsewhere: Secondary | ICD-10-CM | POA: Diagnosis not present

## 2015-08-24 DIAGNOSIS — R0902 Hypoxemia: Secondary | ICD-10-CM | POA: Diagnosis not present

## 2015-08-25 DIAGNOSIS — I2510 Atherosclerotic heart disease of native coronary artery without angina pectoris: Secondary | ICD-10-CM | POA: Diagnosis not present

## 2015-08-25 DIAGNOSIS — R0902 Hypoxemia: Secondary | ICD-10-CM | POA: Diagnosis not present

## 2015-08-25 DIAGNOSIS — J441 Chronic obstructive pulmonary disease with (acute) exacerbation: Secondary | ICD-10-CM | POA: Diagnosis not present

## 2015-08-25 DIAGNOSIS — J918 Pleural effusion in other conditions classified elsewhere: Secondary | ICD-10-CM | POA: Diagnosis not present

## 2015-08-25 DIAGNOSIS — R64 Cachexia: Secondary | ICD-10-CM | POA: Diagnosis not present

## 2015-08-25 DIAGNOSIS — F339 Major depressive disorder, recurrent, unspecified: Secondary | ICD-10-CM | POA: Diagnosis not present

## 2015-08-30 DIAGNOSIS — F339 Major depressive disorder, recurrent, unspecified: Secondary | ICD-10-CM | POA: Diagnosis not present

## 2015-08-30 DIAGNOSIS — J918 Pleural effusion in other conditions classified elsewhere: Secondary | ICD-10-CM | POA: Diagnosis not present

## 2015-08-30 DIAGNOSIS — R64 Cachexia: Secondary | ICD-10-CM | POA: Diagnosis not present

## 2015-08-30 DIAGNOSIS — R0902 Hypoxemia: Secondary | ICD-10-CM | POA: Diagnosis not present

## 2015-08-30 DIAGNOSIS — J441 Chronic obstructive pulmonary disease with (acute) exacerbation: Secondary | ICD-10-CM | POA: Diagnosis not present

## 2015-08-30 DIAGNOSIS — I2510 Atherosclerotic heart disease of native coronary artery without angina pectoris: Secondary | ICD-10-CM | POA: Diagnosis not present

## 2015-09-01 DIAGNOSIS — R64 Cachexia: Secondary | ICD-10-CM | POA: Diagnosis not present

## 2015-09-01 DIAGNOSIS — I2510 Atherosclerotic heart disease of native coronary artery without angina pectoris: Secondary | ICD-10-CM | POA: Diagnosis not present

## 2015-09-01 DIAGNOSIS — J918 Pleural effusion in other conditions classified elsewhere: Secondary | ICD-10-CM | POA: Diagnosis not present

## 2015-09-01 DIAGNOSIS — F339 Major depressive disorder, recurrent, unspecified: Secondary | ICD-10-CM | POA: Diagnosis not present

## 2015-09-01 DIAGNOSIS — J441 Chronic obstructive pulmonary disease with (acute) exacerbation: Secondary | ICD-10-CM | POA: Diagnosis not present

## 2015-09-01 DIAGNOSIS — R0902 Hypoxemia: Secondary | ICD-10-CM | POA: Diagnosis not present

## 2015-09-06 DIAGNOSIS — R0902 Hypoxemia: Secondary | ICD-10-CM | POA: Diagnosis not present

## 2015-09-06 DIAGNOSIS — J441 Chronic obstructive pulmonary disease with (acute) exacerbation: Secondary | ICD-10-CM | POA: Diagnosis not present

## 2015-09-06 DIAGNOSIS — I2510 Atherosclerotic heart disease of native coronary artery without angina pectoris: Secondary | ICD-10-CM | POA: Diagnosis not present

## 2015-09-06 DIAGNOSIS — R64 Cachexia: Secondary | ICD-10-CM | POA: Diagnosis not present

## 2015-09-06 DIAGNOSIS — J918 Pleural effusion in other conditions classified elsewhere: Secondary | ICD-10-CM | POA: Diagnosis not present

## 2015-09-06 DIAGNOSIS — F339 Major depressive disorder, recurrent, unspecified: Secondary | ICD-10-CM | POA: Diagnosis not present

## 2015-09-08 DIAGNOSIS — F419 Anxiety disorder, unspecified: Secondary | ICD-10-CM | POA: Diagnosis not present

## 2015-09-08 DIAGNOSIS — J441 Chronic obstructive pulmonary disease with (acute) exacerbation: Secondary | ICD-10-CM | POA: Diagnosis not present

## 2015-09-08 DIAGNOSIS — Z7952 Long term (current) use of systemic steroids: Secondary | ICD-10-CM | POA: Diagnosis not present

## 2015-09-08 DIAGNOSIS — Z720 Tobacco use: Secondary | ICD-10-CM | POA: Diagnosis not present

## 2015-09-08 DIAGNOSIS — I272 Other secondary pulmonary hypertension: Secondary | ICD-10-CM | POA: Diagnosis not present

## 2015-09-08 DIAGNOSIS — R0602 Shortness of breath: Secondary | ICD-10-CM | POA: Diagnosis not present

## 2015-09-08 DIAGNOSIS — F339 Major depressive disorder, recurrent, unspecified: Secondary | ICD-10-CM | POA: Diagnosis not present

## 2015-09-08 DIAGNOSIS — Z9981 Dependence on supplemental oxygen: Secondary | ICD-10-CM | POA: Diagnosis not present

## 2015-09-08 DIAGNOSIS — R05 Cough: Secondary | ICD-10-CM | POA: Diagnosis not present

## 2015-09-08 DIAGNOSIS — I2510 Atherosclerotic heart disease of native coronary artery without angina pectoris: Secondary | ICD-10-CM | POA: Diagnosis not present

## 2015-09-08 DIAGNOSIS — J9611 Chronic respiratory failure with hypoxia: Secondary | ICD-10-CM | POA: Diagnosis not present

## 2015-09-08 DIAGNOSIS — J918 Pleural effusion in other conditions classified elsewhere: Secondary | ICD-10-CM | POA: Diagnosis not present

## 2015-09-08 DIAGNOSIS — R531 Weakness: Secondary | ICD-10-CM | POA: Diagnosis not present

## 2015-09-08 DIAGNOSIS — R64 Cachexia: Secondary | ICD-10-CM | POA: Diagnosis not present

## 2015-09-08 DIAGNOSIS — Z66 Do not resuscitate: Secondary | ICD-10-CM | POA: Diagnosis not present

## 2015-09-08 DIAGNOSIS — Z9119 Patient's noncompliance with other medical treatment and regimen: Secondary | ICD-10-CM | POA: Diagnosis not present

## 2015-09-08 DIAGNOSIS — I10 Essential (primary) hypertension: Secondary | ICD-10-CM | POA: Diagnosis not present

## 2015-09-08 DIAGNOSIS — Z8701 Personal history of pneumonia (recurrent): Secondary | ICD-10-CM | POA: Diagnosis not present

## 2015-09-08 DIAGNOSIS — J309 Allergic rhinitis, unspecified: Secondary | ICD-10-CM | POA: Diagnosis not present

## 2015-09-08 DIAGNOSIS — R5383 Other fatigue: Secondary | ICD-10-CM | POA: Diagnosis not present

## 2015-09-08 DIAGNOSIS — R0902 Hypoxemia: Secondary | ICD-10-CM | POA: Diagnosis not present

## 2015-09-13 DIAGNOSIS — F339 Major depressive disorder, recurrent, unspecified: Secondary | ICD-10-CM | POA: Diagnosis not present

## 2015-09-13 DIAGNOSIS — J918 Pleural effusion in other conditions classified elsewhere: Secondary | ICD-10-CM | POA: Diagnosis not present

## 2015-09-13 DIAGNOSIS — J441 Chronic obstructive pulmonary disease with (acute) exacerbation: Secondary | ICD-10-CM | POA: Diagnosis not present

## 2015-09-13 DIAGNOSIS — R0902 Hypoxemia: Secondary | ICD-10-CM | POA: Diagnosis not present

## 2015-09-13 DIAGNOSIS — I2510 Atherosclerotic heart disease of native coronary artery without angina pectoris: Secondary | ICD-10-CM | POA: Diagnosis not present

## 2015-09-13 DIAGNOSIS — R64 Cachexia: Secondary | ICD-10-CM | POA: Diagnosis not present

## 2015-09-14 DIAGNOSIS — R0902 Hypoxemia: Secondary | ICD-10-CM | POA: Diagnosis not present

## 2015-09-14 DIAGNOSIS — J918 Pleural effusion in other conditions classified elsewhere: Secondary | ICD-10-CM | POA: Diagnosis not present

## 2015-09-14 DIAGNOSIS — J441 Chronic obstructive pulmonary disease with (acute) exacerbation: Secondary | ICD-10-CM | POA: Diagnosis not present

## 2015-09-14 DIAGNOSIS — R64 Cachexia: Secondary | ICD-10-CM | POA: Diagnosis not present

## 2015-09-14 DIAGNOSIS — I2510 Atherosclerotic heart disease of native coronary artery without angina pectoris: Secondary | ICD-10-CM | POA: Diagnosis not present

## 2015-09-14 DIAGNOSIS — F339 Major depressive disorder, recurrent, unspecified: Secondary | ICD-10-CM | POA: Diagnosis not present

## 2015-09-15 DIAGNOSIS — J441 Chronic obstructive pulmonary disease with (acute) exacerbation: Secondary | ICD-10-CM | POA: Diagnosis not present

## 2015-09-15 DIAGNOSIS — I2510 Atherosclerotic heart disease of native coronary artery without angina pectoris: Secondary | ICD-10-CM | POA: Diagnosis not present

## 2015-09-15 DIAGNOSIS — R0902 Hypoxemia: Secondary | ICD-10-CM | POA: Diagnosis not present

## 2015-09-15 DIAGNOSIS — R64 Cachexia: Secondary | ICD-10-CM | POA: Diagnosis not present

## 2015-09-15 DIAGNOSIS — J918 Pleural effusion in other conditions classified elsewhere: Secondary | ICD-10-CM | POA: Diagnosis not present

## 2015-09-15 DIAGNOSIS — F339 Major depressive disorder, recurrent, unspecified: Secondary | ICD-10-CM | POA: Diagnosis not present

## 2015-09-20 DIAGNOSIS — I2510 Atherosclerotic heart disease of native coronary artery without angina pectoris: Secondary | ICD-10-CM | POA: Diagnosis not present

## 2015-09-20 DIAGNOSIS — J441 Chronic obstructive pulmonary disease with (acute) exacerbation: Secondary | ICD-10-CM | POA: Diagnosis not present

## 2015-09-20 DIAGNOSIS — R64 Cachexia: Secondary | ICD-10-CM | POA: Diagnosis not present

## 2015-09-20 DIAGNOSIS — J918 Pleural effusion in other conditions classified elsewhere: Secondary | ICD-10-CM | POA: Diagnosis not present

## 2015-09-20 DIAGNOSIS — F339 Major depressive disorder, recurrent, unspecified: Secondary | ICD-10-CM | POA: Diagnosis not present

## 2015-09-20 DIAGNOSIS — R0902 Hypoxemia: Secondary | ICD-10-CM | POA: Diagnosis not present

## 2015-09-22 DIAGNOSIS — F339 Major depressive disorder, recurrent, unspecified: Secondary | ICD-10-CM | POA: Diagnosis not present

## 2015-09-22 DIAGNOSIS — J441 Chronic obstructive pulmonary disease with (acute) exacerbation: Secondary | ICD-10-CM | POA: Diagnosis not present

## 2015-09-22 DIAGNOSIS — R0902 Hypoxemia: Secondary | ICD-10-CM | POA: Diagnosis not present

## 2015-09-22 DIAGNOSIS — J918 Pleural effusion in other conditions classified elsewhere: Secondary | ICD-10-CM | POA: Diagnosis not present

## 2015-09-22 DIAGNOSIS — R64 Cachexia: Secondary | ICD-10-CM | POA: Diagnosis not present

## 2015-09-22 DIAGNOSIS — I2510 Atherosclerotic heart disease of native coronary artery without angina pectoris: Secondary | ICD-10-CM | POA: Diagnosis not present

## 2015-09-25 DIAGNOSIS — J441 Chronic obstructive pulmonary disease with (acute) exacerbation: Secondary | ICD-10-CM | POA: Diagnosis not present

## 2015-09-25 DIAGNOSIS — F339 Major depressive disorder, recurrent, unspecified: Secondary | ICD-10-CM | POA: Diagnosis not present

## 2015-09-25 DIAGNOSIS — J918 Pleural effusion in other conditions classified elsewhere: Secondary | ICD-10-CM | POA: Diagnosis not present

## 2015-09-25 DIAGNOSIS — R64 Cachexia: Secondary | ICD-10-CM | POA: Diagnosis not present

## 2015-09-25 DIAGNOSIS — R0902 Hypoxemia: Secondary | ICD-10-CM | POA: Diagnosis not present

## 2015-09-25 DIAGNOSIS — I2510 Atherosclerotic heart disease of native coronary artery without angina pectoris: Secondary | ICD-10-CM | POA: Diagnosis not present

## 2015-09-27 DIAGNOSIS — J918 Pleural effusion in other conditions classified elsewhere: Secondary | ICD-10-CM | POA: Diagnosis not present

## 2015-09-27 DIAGNOSIS — R0902 Hypoxemia: Secondary | ICD-10-CM | POA: Diagnosis not present

## 2015-09-27 DIAGNOSIS — J441 Chronic obstructive pulmonary disease with (acute) exacerbation: Secondary | ICD-10-CM | POA: Diagnosis not present

## 2015-09-27 DIAGNOSIS — I2510 Atherosclerotic heart disease of native coronary artery without angina pectoris: Secondary | ICD-10-CM | POA: Diagnosis not present

## 2015-09-27 DIAGNOSIS — F339 Major depressive disorder, recurrent, unspecified: Secondary | ICD-10-CM | POA: Diagnosis not present

## 2015-09-27 DIAGNOSIS — R64 Cachexia: Secondary | ICD-10-CM | POA: Diagnosis not present

## 2015-09-29 DIAGNOSIS — J441 Chronic obstructive pulmonary disease with (acute) exacerbation: Secondary | ICD-10-CM | POA: Diagnosis not present

## 2015-09-29 DIAGNOSIS — R64 Cachexia: Secondary | ICD-10-CM | POA: Diagnosis not present

## 2015-09-29 DIAGNOSIS — F339 Major depressive disorder, recurrent, unspecified: Secondary | ICD-10-CM | POA: Diagnosis not present

## 2015-09-29 DIAGNOSIS — R0902 Hypoxemia: Secondary | ICD-10-CM | POA: Diagnosis not present

## 2015-09-29 DIAGNOSIS — I2510 Atherosclerotic heart disease of native coronary artery without angina pectoris: Secondary | ICD-10-CM | POA: Diagnosis not present

## 2015-09-29 DIAGNOSIS — J918 Pleural effusion in other conditions classified elsewhere: Secondary | ICD-10-CM | POA: Diagnosis not present

## 2015-10-04 DIAGNOSIS — R64 Cachexia: Secondary | ICD-10-CM | POA: Diagnosis not present

## 2015-10-04 DIAGNOSIS — J441 Chronic obstructive pulmonary disease with (acute) exacerbation: Secondary | ICD-10-CM | POA: Diagnosis not present

## 2015-10-04 DIAGNOSIS — I2510 Atherosclerotic heart disease of native coronary artery without angina pectoris: Secondary | ICD-10-CM | POA: Diagnosis not present

## 2015-10-04 DIAGNOSIS — F339 Major depressive disorder, recurrent, unspecified: Secondary | ICD-10-CM | POA: Diagnosis not present

## 2015-10-04 DIAGNOSIS — R0902 Hypoxemia: Secondary | ICD-10-CM | POA: Diagnosis not present

## 2015-10-04 DIAGNOSIS — J918 Pleural effusion in other conditions classified elsewhere: Secondary | ICD-10-CM | POA: Diagnosis not present

## 2015-10-06 DIAGNOSIS — J441 Chronic obstructive pulmonary disease with (acute) exacerbation: Secondary | ICD-10-CM | POA: Diagnosis not present

## 2015-10-06 DIAGNOSIS — F339 Major depressive disorder, recurrent, unspecified: Secondary | ICD-10-CM | POA: Diagnosis not present

## 2015-10-06 DIAGNOSIS — R64 Cachexia: Secondary | ICD-10-CM | POA: Diagnosis not present

## 2015-10-06 DIAGNOSIS — J918 Pleural effusion in other conditions classified elsewhere: Secondary | ICD-10-CM | POA: Diagnosis not present

## 2015-10-06 DIAGNOSIS — I2510 Atherosclerotic heart disease of native coronary artery without angina pectoris: Secondary | ICD-10-CM | POA: Diagnosis not present

## 2015-10-06 DIAGNOSIS — R0902 Hypoxemia: Secondary | ICD-10-CM | POA: Diagnosis not present

## 2015-10-08 DIAGNOSIS — Z9119 Patient's noncompliance with other medical treatment and regimen: Secondary | ICD-10-CM | POA: Diagnosis not present

## 2015-10-08 DIAGNOSIS — R64 Cachexia: Secondary | ICD-10-CM | POA: Diagnosis not present

## 2015-10-08 DIAGNOSIS — R531 Weakness: Secondary | ICD-10-CM | POA: Diagnosis not present

## 2015-10-08 DIAGNOSIS — J918 Pleural effusion in other conditions classified elsewhere: Secondary | ICD-10-CM | POA: Diagnosis not present

## 2015-10-08 DIAGNOSIS — Z66 Do not resuscitate: Secondary | ICD-10-CM | POA: Diagnosis not present

## 2015-10-08 DIAGNOSIS — I10 Essential (primary) hypertension: Secondary | ICD-10-CM | POA: Diagnosis not present

## 2015-10-08 DIAGNOSIS — I2510 Atherosclerotic heart disease of native coronary artery without angina pectoris: Secondary | ICD-10-CM | POA: Diagnosis not present

## 2015-10-08 DIAGNOSIS — J9611 Chronic respiratory failure with hypoxia: Secondary | ICD-10-CM | POA: Diagnosis not present

## 2015-10-08 DIAGNOSIS — Z9981 Dependence on supplemental oxygen: Secondary | ICD-10-CM | POA: Diagnosis not present

## 2015-10-08 DIAGNOSIS — Z7952 Long term (current) use of systemic steroids: Secondary | ICD-10-CM | POA: Diagnosis not present

## 2015-10-08 DIAGNOSIS — R0602 Shortness of breath: Secondary | ICD-10-CM | POA: Diagnosis not present

## 2015-10-08 DIAGNOSIS — R5383 Other fatigue: Secondary | ICD-10-CM | POA: Diagnosis not present

## 2015-10-08 DIAGNOSIS — Z720 Tobacco use: Secondary | ICD-10-CM | POA: Diagnosis not present

## 2015-10-08 DIAGNOSIS — I272 Other secondary pulmonary hypertension: Secondary | ICD-10-CM | POA: Diagnosis not present

## 2015-10-08 DIAGNOSIS — Z8701 Personal history of pneumonia (recurrent): Secondary | ICD-10-CM | POA: Diagnosis not present

## 2015-10-08 DIAGNOSIS — J309 Allergic rhinitis, unspecified: Secondary | ICD-10-CM | POA: Diagnosis not present

## 2015-10-08 DIAGNOSIS — R05 Cough: Secondary | ICD-10-CM | POA: Diagnosis not present

## 2015-10-08 DIAGNOSIS — R0902 Hypoxemia: Secondary | ICD-10-CM | POA: Diagnosis not present

## 2015-10-08 DIAGNOSIS — F339 Major depressive disorder, recurrent, unspecified: Secondary | ICD-10-CM | POA: Diagnosis not present

## 2015-10-08 DIAGNOSIS — J441 Chronic obstructive pulmonary disease with (acute) exacerbation: Secondary | ICD-10-CM | POA: Diagnosis not present

## 2015-10-08 DIAGNOSIS — F419 Anxiety disorder, unspecified: Secondary | ICD-10-CM | POA: Diagnosis not present

## 2015-10-11 DIAGNOSIS — J441 Chronic obstructive pulmonary disease with (acute) exacerbation: Secondary | ICD-10-CM | POA: Diagnosis not present

## 2015-10-11 DIAGNOSIS — R0902 Hypoxemia: Secondary | ICD-10-CM | POA: Diagnosis not present

## 2015-10-11 DIAGNOSIS — R64 Cachexia: Secondary | ICD-10-CM | POA: Diagnosis not present

## 2015-10-11 DIAGNOSIS — J918 Pleural effusion in other conditions classified elsewhere: Secondary | ICD-10-CM | POA: Diagnosis not present

## 2015-10-11 DIAGNOSIS — F339 Major depressive disorder, recurrent, unspecified: Secondary | ICD-10-CM | POA: Diagnosis not present

## 2015-10-11 DIAGNOSIS — I2510 Atherosclerotic heart disease of native coronary artery without angina pectoris: Secondary | ICD-10-CM | POA: Diagnosis not present

## 2015-10-13 DIAGNOSIS — R0902 Hypoxemia: Secondary | ICD-10-CM | POA: Diagnosis not present

## 2015-10-13 DIAGNOSIS — J918 Pleural effusion in other conditions classified elsewhere: Secondary | ICD-10-CM | POA: Diagnosis not present

## 2015-10-13 DIAGNOSIS — F339 Major depressive disorder, recurrent, unspecified: Secondary | ICD-10-CM | POA: Diagnosis not present

## 2015-10-13 DIAGNOSIS — I2510 Atherosclerotic heart disease of native coronary artery without angina pectoris: Secondary | ICD-10-CM | POA: Diagnosis not present

## 2015-10-13 DIAGNOSIS — R64 Cachexia: Secondary | ICD-10-CM | POA: Diagnosis not present

## 2015-10-13 DIAGNOSIS — J441 Chronic obstructive pulmonary disease with (acute) exacerbation: Secondary | ICD-10-CM | POA: Diagnosis not present

## 2015-10-14 DIAGNOSIS — J918 Pleural effusion in other conditions classified elsewhere: Secondary | ICD-10-CM | POA: Diagnosis not present

## 2015-10-14 DIAGNOSIS — R64 Cachexia: Secondary | ICD-10-CM | POA: Diagnosis not present

## 2015-10-14 DIAGNOSIS — I2510 Atherosclerotic heart disease of native coronary artery without angina pectoris: Secondary | ICD-10-CM | POA: Diagnosis not present

## 2015-10-14 DIAGNOSIS — J441 Chronic obstructive pulmonary disease with (acute) exacerbation: Secondary | ICD-10-CM | POA: Diagnosis not present

## 2015-10-14 DIAGNOSIS — F339 Major depressive disorder, recurrent, unspecified: Secondary | ICD-10-CM | POA: Diagnosis not present

## 2015-10-14 DIAGNOSIS — R0902 Hypoxemia: Secondary | ICD-10-CM | POA: Diagnosis not present

## 2015-10-18 DIAGNOSIS — F339 Major depressive disorder, recurrent, unspecified: Secondary | ICD-10-CM | POA: Diagnosis not present

## 2015-10-18 DIAGNOSIS — R64 Cachexia: Secondary | ICD-10-CM | POA: Diagnosis not present

## 2015-10-18 DIAGNOSIS — R0902 Hypoxemia: Secondary | ICD-10-CM | POA: Diagnosis not present

## 2015-10-18 DIAGNOSIS — J441 Chronic obstructive pulmonary disease with (acute) exacerbation: Secondary | ICD-10-CM | POA: Diagnosis not present

## 2015-10-18 DIAGNOSIS — I2510 Atherosclerotic heart disease of native coronary artery without angina pectoris: Secondary | ICD-10-CM | POA: Diagnosis not present

## 2015-10-18 DIAGNOSIS — J918 Pleural effusion in other conditions classified elsewhere: Secondary | ICD-10-CM | POA: Diagnosis not present

## 2015-10-20 DIAGNOSIS — F339 Major depressive disorder, recurrent, unspecified: Secondary | ICD-10-CM | POA: Diagnosis not present

## 2015-10-20 DIAGNOSIS — R0902 Hypoxemia: Secondary | ICD-10-CM | POA: Diagnosis not present

## 2015-10-20 DIAGNOSIS — J918 Pleural effusion in other conditions classified elsewhere: Secondary | ICD-10-CM | POA: Diagnosis not present

## 2015-10-20 DIAGNOSIS — I2510 Atherosclerotic heart disease of native coronary artery without angina pectoris: Secondary | ICD-10-CM | POA: Diagnosis not present

## 2015-10-20 DIAGNOSIS — R64 Cachexia: Secondary | ICD-10-CM | POA: Diagnosis not present

## 2015-10-20 DIAGNOSIS — J441 Chronic obstructive pulmonary disease with (acute) exacerbation: Secondary | ICD-10-CM | POA: Diagnosis not present

## 2015-10-21 DIAGNOSIS — R64 Cachexia: Secondary | ICD-10-CM | POA: Diagnosis not present

## 2015-10-21 DIAGNOSIS — J441 Chronic obstructive pulmonary disease with (acute) exacerbation: Secondary | ICD-10-CM | POA: Diagnosis not present

## 2015-10-21 DIAGNOSIS — I2510 Atherosclerotic heart disease of native coronary artery without angina pectoris: Secondary | ICD-10-CM | POA: Diagnosis not present

## 2015-10-21 DIAGNOSIS — F339 Major depressive disorder, recurrent, unspecified: Secondary | ICD-10-CM | POA: Diagnosis not present

## 2015-10-21 DIAGNOSIS — R0902 Hypoxemia: Secondary | ICD-10-CM | POA: Diagnosis not present

## 2015-10-21 DIAGNOSIS — J918 Pleural effusion in other conditions classified elsewhere: Secondary | ICD-10-CM | POA: Diagnosis not present

## 2015-10-25 DIAGNOSIS — F339 Major depressive disorder, recurrent, unspecified: Secondary | ICD-10-CM | POA: Diagnosis not present

## 2015-10-25 DIAGNOSIS — J918 Pleural effusion in other conditions classified elsewhere: Secondary | ICD-10-CM | POA: Diagnosis not present

## 2015-10-25 DIAGNOSIS — I2510 Atherosclerotic heart disease of native coronary artery without angina pectoris: Secondary | ICD-10-CM | POA: Diagnosis not present

## 2015-10-25 DIAGNOSIS — R64 Cachexia: Secondary | ICD-10-CM | POA: Diagnosis not present

## 2015-10-25 DIAGNOSIS — J441 Chronic obstructive pulmonary disease with (acute) exacerbation: Secondary | ICD-10-CM | POA: Diagnosis not present

## 2015-10-25 DIAGNOSIS — R0902 Hypoxemia: Secondary | ICD-10-CM | POA: Diagnosis not present

## 2015-10-27 DIAGNOSIS — R64 Cachexia: Secondary | ICD-10-CM | POA: Diagnosis not present

## 2015-10-27 DIAGNOSIS — J441 Chronic obstructive pulmonary disease with (acute) exacerbation: Secondary | ICD-10-CM | POA: Diagnosis not present

## 2015-10-27 DIAGNOSIS — I2510 Atherosclerotic heart disease of native coronary artery without angina pectoris: Secondary | ICD-10-CM | POA: Diagnosis not present

## 2015-10-27 DIAGNOSIS — R0902 Hypoxemia: Secondary | ICD-10-CM | POA: Diagnosis not present

## 2015-10-27 DIAGNOSIS — F339 Major depressive disorder, recurrent, unspecified: Secondary | ICD-10-CM | POA: Diagnosis not present

## 2015-10-27 DIAGNOSIS — J918 Pleural effusion in other conditions classified elsewhere: Secondary | ICD-10-CM | POA: Diagnosis not present

## 2015-11-01 DIAGNOSIS — F339 Major depressive disorder, recurrent, unspecified: Secondary | ICD-10-CM | POA: Diagnosis not present

## 2015-11-01 DIAGNOSIS — J918 Pleural effusion in other conditions classified elsewhere: Secondary | ICD-10-CM | POA: Diagnosis not present

## 2015-11-01 DIAGNOSIS — I2510 Atherosclerotic heart disease of native coronary artery without angina pectoris: Secondary | ICD-10-CM | POA: Diagnosis not present

## 2015-11-01 DIAGNOSIS — J441 Chronic obstructive pulmonary disease with (acute) exacerbation: Secondary | ICD-10-CM | POA: Diagnosis not present

## 2015-11-01 DIAGNOSIS — R64 Cachexia: Secondary | ICD-10-CM | POA: Diagnosis not present

## 2015-11-01 DIAGNOSIS — R0902 Hypoxemia: Secondary | ICD-10-CM | POA: Diagnosis not present

## 2015-11-03 DIAGNOSIS — R0902 Hypoxemia: Secondary | ICD-10-CM | POA: Diagnosis not present

## 2015-11-03 DIAGNOSIS — J441 Chronic obstructive pulmonary disease with (acute) exacerbation: Secondary | ICD-10-CM | POA: Diagnosis not present

## 2015-11-03 DIAGNOSIS — F339 Major depressive disorder, recurrent, unspecified: Secondary | ICD-10-CM | POA: Diagnosis not present

## 2015-11-03 DIAGNOSIS — R64 Cachexia: Secondary | ICD-10-CM | POA: Diagnosis not present

## 2015-11-03 DIAGNOSIS — J918 Pleural effusion in other conditions classified elsewhere: Secondary | ICD-10-CM | POA: Diagnosis not present

## 2015-11-03 DIAGNOSIS — I2510 Atherosclerotic heart disease of native coronary artery without angina pectoris: Secondary | ICD-10-CM | POA: Diagnosis not present

## 2015-11-08 DIAGNOSIS — I2510 Atherosclerotic heart disease of native coronary artery without angina pectoris: Secondary | ICD-10-CM | POA: Diagnosis not present

## 2015-11-08 DIAGNOSIS — R531 Weakness: Secondary | ICD-10-CM | POA: Diagnosis not present

## 2015-11-08 DIAGNOSIS — R0902 Hypoxemia: Secondary | ICD-10-CM | POA: Diagnosis not present

## 2015-11-08 DIAGNOSIS — J309 Allergic rhinitis, unspecified: Secondary | ICD-10-CM | POA: Diagnosis not present

## 2015-11-08 DIAGNOSIS — Z8701 Personal history of pneumonia (recurrent): Secondary | ICD-10-CM | POA: Diagnosis not present

## 2015-11-08 DIAGNOSIS — Z9981 Dependence on supplemental oxygen: Secondary | ICD-10-CM | POA: Diagnosis not present

## 2015-11-08 DIAGNOSIS — Z66 Do not resuscitate: Secondary | ICD-10-CM | POA: Diagnosis not present

## 2015-11-08 DIAGNOSIS — J441 Chronic obstructive pulmonary disease with (acute) exacerbation: Secondary | ICD-10-CM | POA: Diagnosis not present

## 2015-11-08 DIAGNOSIS — R0602 Shortness of breath: Secondary | ICD-10-CM | POA: Diagnosis not present

## 2015-11-08 DIAGNOSIS — R5383 Other fatigue: Secondary | ICD-10-CM | POA: Diagnosis not present

## 2015-11-08 DIAGNOSIS — R05 Cough: Secondary | ICD-10-CM | POA: Diagnosis not present

## 2015-11-08 DIAGNOSIS — I272 Other secondary pulmonary hypertension: Secondary | ICD-10-CM | POA: Diagnosis not present

## 2015-11-08 DIAGNOSIS — F419 Anxiety disorder, unspecified: Secondary | ICD-10-CM | POA: Diagnosis not present

## 2015-11-08 DIAGNOSIS — F339 Major depressive disorder, recurrent, unspecified: Secondary | ICD-10-CM | POA: Diagnosis not present

## 2015-11-08 DIAGNOSIS — I10 Essential (primary) hypertension: Secondary | ICD-10-CM | POA: Diagnosis not present

## 2015-11-08 DIAGNOSIS — J9611 Chronic respiratory failure with hypoxia: Secondary | ICD-10-CM | POA: Diagnosis not present

## 2015-11-08 DIAGNOSIS — J918 Pleural effusion in other conditions classified elsewhere: Secondary | ICD-10-CM | POA: Diagnosis not present

## 2015-11-08 DIAGNOSIS — Z720 Tobacco use: Secondary | ICD-10-CM | POA: Diagnosis not present

## 2015-11-10 DIAGNOSIS — J441 Chronic obstructive pulmonary disease with (acute) exacerbation: Secondary | ICD-10-CM | POA: Diagnosis not present

## 2015-11-10 DIAGNOSIS — F419 Anxiety disorder, unspecified: Secondary | ICD-10-CM | POA: Diagnosis not present

## 2015-11-10 DIAGNOSIS — R5383 Other fatigue: Secondary | ICD-10-CM | POA: Diagnosis not present

## 2015-11-10 DIAGNOSIS — R0602 Shortness of breath: Secondary | ICD-10-CM | POA: Diagnosis not present

## 2015-11-10 DIAGNOSIS — R531 Weakness: Secondary | ICD-10-CM | POA: Diagnosis not present

## 2015-11-10 DIAGNOSIS — R0902 Hypoxemia: Secondary | ICD-10-CM | POA: Diagnosis not present

## 2015-11-15 DIAGNOSIS — R0602 Shortness of breath: Secondary | ICD-10-CM | POA: Diagnosis not present

## 2015-11-15 DIAGNOSIS — J441 Chronic obstructive pulmonary disease with (acute) exacerbation: Secondary | ICD-10-CM | POA: Diagnosis not present

## 2015-11-15 DIAGNOSIS — R0902 Hypoxemia: Secondary | ICD-10-CM | POA: Diagnosis not present

## 2015-11-15 DIAGNOSIS — R531 Weakness: Secondary | ICD-10-CM | POA: Diagnosis not present

## 2015-11-15 DIAGNOSIS — R5383 Other fatigue: Secondary | ICD-10-CM | POA: Diagnosis not present

## 2015-11-15 DIAGNOSIS — F419 Anxiety disorder, unspecified: Secondary | ICD-10-CM | POA: Diagnosis not present

## 2015-11-17 DIAGNOSIS — R0602 Shortness of breath: Secondary | ICD-10-CM | POA: Diagnosis not present

## 2015-11-17 DIAGNOSIS — R531 Weakness: Secondary | ICD-10-CM | POA: Diagnosis not present

## 2015-11-17 DIAGNOSIS — R0902 Hypoxemia: Secondary | ICD-10-CM | POA: Diagnosis not present

## 2015-11-17 DIAGNOSIS — R5383 Other fatigue: Secondary | ICD-10-CM | POA: Diagnosis not present

## 2015-11-17 DIAGNOSIS — F419 Anxiety disorder, unspecified: Secondary | ICD-10-CM | POA: Diagnosis not present

## 2015-11-17 DIAGNOSIS — J441 Chronic obstructive pulmonary disease with (acute) exacerbation: Secondary | ICD-10-CM | POA: Diagnosis not present

## 2015-11-19 DIAGNOSIS — J441 Chronic obstructive pulmonary disease with (acute) exacerbation: Secondary | ICD-10-CM | POA: Diagnosis not present

## 2015-11-19 DIAGNOSIS — R0902 Hypoxemia: Secondary | ICD-10-CM | POA: Diagnosis not present

## 2015-11-19 DIAGNOSIS — F419 Anxiety disorder, unspecified: Secondary | ICD-10-CM | POA: Diagnosis not present

## 2015-11-19 DIAGNOSIS — R5383 Other fatigue: Secondary | ICD-10-CM | POA: Diagnosis not present

## 2015-11-19 DIAGNOSIS — R0602 Shortness of breath: Secondary | ICD-10-CM | POA: Diagnosis not present

## 2015-11-19 DIAGNOSIS — R531 Weakness: Secondary | ICD-10-CM | POA: Diagnosis not present

## 2015-11-20 DIAGNOSIS — R0602 Shortness of breath: Secondary | ICD-10-CM | POA: Diagnosis not present

## 2015-11-20 DIAGNOSIS — F419 Anxiety disorder, unspecified: Secondary | ICD-10-CM | POA: Diagnosis not present

## 2015-11-20 DIAGNOSIS — R531 Weakness: Secondary | ICD-10-CM | POA: Diagnosis not present

## 2015-11-20 DIAGNOSIS — R0902 Hypoxemia: Secondary | ICD-10-CM | POA: Diagnosis not present

## 2015-11-20 DIAGNOSIS — R5383 Other fatigue: Secondary | ICD-10-CM | POA: Diagnosis not present

## 2015-11-20 DIAGNOSIS — J441 Chronic obstructive pulmonary disease with (acute) exacerbation: Secondary | ICD-10-CM | POA: Diagnosis not present

## 2015-11-22 DIAGNOSIS — R0602 Shortness of breath: Secondary | ICD-10-CM | POA: Diagnosis not present

## 2015-11-22 DIAGNOSIS — R5383 Other fatigue: Secondary | ICD-10-CM | POA: Diagnosis not present

## 2015-11-22 DIAGNOSIS — R0902 Hypoxemia: Secondary | ICD-10-CM | POA: Diagnosis not present

## 2015-11-22 DIAGNOSIS — F419 Anxiety disorder, unspecified: Secondary | ICD-10-CM | POA: Diagnosis not present

## 2015-11-22 DIAGNOSIS — R531 Weakness: Secondary | ICD-10-CM | POA: Diagnosis not present

## 2015-11-22 DIAGNOSIS — J441 Chronic obstructive pulmonary disease with (acute) exacerbation: Secondary | ICD-10-CM | POA: Diagnosis not present

## 2015-11-24 DIAGNOSIS — R5383 Other fatigue: Secondary | ICD-10-CM | POA: Diagnosis not present

## 2015-11-24 DIAGNOSIS — F419 Anxiety disorder, unspecified: Secondary | ICD-10-CM | POA: Diagnosis not present

## 2015-11-24 DIAGNOSIS — J441 Chronic obstructive pulmonary disease with (acute) exacerbation: Secondary | ICD-10-CM | POA: Diagnosis not present

## 2015-11-24 DIAGNOSIS — R0602 Shortness of breath: Secondary | ICD-10-CM | POA: Diagnosis not present

## 2015-11-24 DIAGNOSIS — R0902 Hypoxemia: Secondary | ICD-10-CM | POA: Diagnosis not present

## 2015-11-24 DIAGNOSIS — R531 Weakness: Secondary | ICD-10-CM | POA: Diagnosis not present

## 2015-11-29 DIAGNOSIS — R0602 Shortness of breath: Secondary | ICD-10-CM | POA: Diagnosis not present

## 2015-11-29 DIAGNOSIS — R531 Weakness: Secondary | ICD-10-CM | POA: Diagnosis not present

## 2015-11-29 DIAGNOSIS — J441 Chronic obstructive pulmonary disease with (acute) exacerbation: Secondary | ICD-10-CM | POA: Diagnosis not present

## 2015-11-29 DIAGNOSIS — R5383 Other fatigue: Secondary | ICD-10-CM | POA: Diagnosis not present

## 2015-11-29 DIAGNOSIS — R0902 Hypoxemia: Secondary | ICD-10-CM | POA: Diagnosis not present

## 2015-11-29 DIAGNOSIS — F419 Anxiety disorder, unspecified: Secondary | ICD-10-CM | POA: Diagnosis not present

## 2015-12-01 DIAGNOSIS — F419 Anxiety disorder, unspecified: Secondary | ICD-10-CM | POA: Diagnosis not present

## 2015-12-01 DIAGNOSIS — R0902 Hypoxemia: Secondary | ICD-10-CM | POA: Diagnosis not present

## 2015-12-01 DIAGNOSIS — R5383 Other fatigue: Secondary | ICD-10-CM | POA: Diagnosis not present

## 2015-12-01 DIAGNOSIS — J441 Chronic obstructive pulmonary disease with (acute) exacerbation: Secondary | ICD-10-CM | POA: Diagnosis not present

## 2015-12-01 DIAGNOSIS — R0602 Shortness of breath: Secondary | ICD-10-CM | POA: Diagnosis not present

## 2015-12-01 DIAGNOSIS — R531 Weakness: Secondary | ICD-10-CM | POA: Diagnosis not present

## 2015-12-06 DIAGNOSIS — J441 Chronic obstructive pulmonary disease with (acute) exacerbation: Secondary | ICD-10-CM | POA: Diagnosis not present

## 2015-12-06 DIAGNOSIS — R0602 Shortness of breath: Secondary | ICD-10-CM | POA: Diagnosis not present

## 2015-12-06 DIAGNOSIS — R5383 Other fatigue: Secondary | ICD-10-CM | POA: Diagnosis not present

## 2015-12-06 DIAGNOSIS — R531 Weakness: Secondary | ICD-10-CM | POA: Diagnosis not present

## 2015-12-06 DIAGNOSIS — R0902 Hypoxemia: Secondary | ICD-10-CM | POA: Diagnosis not present

## 2015-12-06 DIAGNOSIS — F419 Anxiety disorder, unspecified: Secondary | ICD-10-CM | POA: Diagnosis not present

## 2015-12-07 DIAGNOSIS — J441 Chronic obstructive pulmonary disease with (acute) exacerbation: Secondary | ICD-10-CM | POA: Diagnosis not present

## 2015-12-07 DIAGNOSIS — F419 Anxiety disorder, unspecified: Secondary | ICD-10-CM | POA: Diagnosis not present

## 2015-12-07 DIAGNOSIS — R5383 Other fatigue: Secondary | ICD-10-CM | POA: Diagnosis not present

## 2015-12-07 DIAGNOSIS — R0602 Shortness of breath: Secondary | ICD-10-CM | POA: Diagnosis not present

## 2015-12-07 DIAGNOSIS — R0902 Hypoxemia: Secondary | ICD-10-CM | POA: Diagnosis not present

## 2015-12-07 DIAGNOSIS — R531 Weakness: Secondary | ICD-10-CM | POA: Diagnosis not present

## 2015-12-08 DIAGNOSIS — J441 Chronic obstructive pulmonary disease with (acute) exacerbation: Secondary | ICD-10-CM | POA: Diagnosis not present

## 2015-12-08 DIAGNOSIS — R0602 Shortness of breath: Secondary | ICD-10-CM | POA: Diagnosis not present

## 2015-12-08 DIAGNOSIS — R0902 Hypoxemia: Secondary | ICD-10-CM | POA: Diagnosis not present

## 2015-12-08 DIAGNOSIS — R5383 Other fatigue: Secondary | ICD-10-CM | POA: Diagnosis not present

## 2015-12-08 DIAGNOSIS — R531 Weakness: Secondary | ICD-10-CM | POA: Diagnosis not present

## 2015-12-08 DIAGNOSIS — F419 Anxiety disorder, unspecified: Secondary | ICD-10-CM | POA: Diagnosis not present

## 2015-12-09 DIAGNOSIS — R05 Cough: Secondary | ICD-10-CM | POA: Diagnosis not present

## 2015-12-09 DIAGNOSIS — F339 Major depressive disorder, recurrent, unspecified: Secondary | ICD-10-CM | POA: Diagnosis not present

## 2015-12-09 DIAGNOSIS — I2510 Atherosclerotic heart disease of native coronary artery without angina pectoris: Secondary | ICD-10-CM | POA: Diagnosis not present

## 2015-12-09 DIAGNOSIS — Z8701 Personal history of pneumonia (recurrent): Secondary | ICD-10-CM | POA: Diagnosis not present

## 2015-12-09 DIAGNOSIS — I272 Other secondary pulmonary hypertension: Secondary | ICD-10-CM | POA: Diagnosis not present

## 2015-12-09 DIAGNOSIS — Z720 Tobacco use: Secondary | ICD-10-CM | POA: Diagnosis not present

## 2015-12-09 DIAGNOSIS — J918 Pleural effusion in other conditions classified elsewhere: Secondary | ICD-10-CM | POA: Diagnosis not present

## 2015-12-09 DIAGNOSIS — Z9981 Dependence on supplemental oxygen: Secondary | ICD-10-CM | POA: Diagnosis not present

## 2015-12-09 DIAGNOSIS — J309 Allergic rhinitis, unspecified: Secondary | ICD-10-CM | POA: Diagnosis not present

## 2015-12-09 DIAGNOSIS — R64 Cachexia: Secondary | ICD-10-CM | POA: Diagnosis not present

## 2015-12-09 DIAGNOSIS — Z7952 Long term (current) use of systemic steroids: Secondary | ICD-10-CM | POA: Diagnosis not present

## 2015-12-09 DIAGNOSIS — Z9119 Patient's noncompliance with other medical treatment and regimen: Secondary | ICD-10-CM | POA: Diagnosis not present

## 2015-12-13 DIAGNOSIS — I272 Other secondary pulmonary hypertension: Secondary | ICD-10-CM | POA: Diagnosis not present

## 2015-12-13 DIAGNOSIS — Z8701 Personal history of pneumonia (recurrent): Secondary | ICD-10-CM | POA: Diagnosis not present

## 2015-12-13 DIAGNOSIS — I2510 Atherosclerotic heart disease of native coronary artery without angina pectoris: Secondary | ICD-10-CM | POA: Diagnosis not present

## 2015-12-13 DIAGNOSIS — Z720 Tobacco use: Secondary | ICD-10-CM | POA: Diagnosis not present

## 2015-12-13 DIAGNOSIS — F339 Major depressive disorder, recurrent, unspecified: Secondary | ICD-10-CM | POA: Diagnosis not present

## 2015-12-13 DIAGNOSIS — J918 Pleural effusion in other conditions classified elsewhere: Secondary | ICD-10-CM | POA: Diagnosis not present

## 2015-12-14 DIAGNOSIS — J918 Pleural effusion in other conditions classified elsewhere: Secondary | ICD-10-CM | POA: Diagnosis not present

## 2015-12-14 DIAGNOSIS — I272 Other secondary pulmonary hypertension: Secondary | ICD-10-CM | POA: Diagnosis not present

## 2015-12-14 DIAGNOSIS — Z8701 Personal history of pneumonia (recurrent): Secondary | ICD-10-CM | POA: Diagnosis not present

## 2015-12-14 DIAGNOSIS — F339 Major depressive disorder, recurrent, unspecified: Secondary | ICD-10-CM | POA: Diagnosis not present

## 2015-12-14 DIAGNOSIS — I2510 Atherosclerotic heart disease of native coronary artery without angina pectoris: Secondary | ICD-10-CM | POA: Diagnosis not present

## 2015-12-14 DIAGNOSIS — Z720 Tobacco use: Secondary | ICD-10-CM | POA: Diagnosis not present

## 2015-12-15 DIAGNOSIS — Z8701 Personal history of pneumonia (recurrent): Secondary | ICD-10-CM | POA: Diagnosis not present

## 2015-12-15 DIAGNOSIS — I2510 Atherosclerotic heart disease of native coronary artery without angina pectoris: Secondary | ICD-10-CM | POA: Diagnosis not present

## 2015-12-15 DIAGNOSIS — J918 Pleural effusion in other conditions classified elsewhere: Secondary | ICD-10-CM | POA: Diagnosis not present

## 2015-12-15 DIAGNOSIS — F339 Major depressive disorder, recurrent, unspecified: Secondary | ICD-10-CM | POA: Diagnosis not present

## 2015-12-15 DIAGNOSIS — I272 Other secondary pulmonary hypertension: Secondary | ICD-10-CM | POA: Diagnosis not present

## 2015-12-15 DIAGNOSIS — Z720 Tobacco use: Secondary | ICD-10-CM | POA: Diagnosis not present

## 2015-12-16 DIAGNOSIS — J918 Pleural effusion in other conditions classified elsewhere: Secondary | ICD-10-CM | POA: Diagnosis not present

## 2015-12-16 DIAGNOSIS — I272 Other secondary pulmonary hypertension: Secondary | ICD-10-CM | POA: Diagnosis not present

## 2015-12-16 DIAGNOSIS — Z8701 Personal history of pneumonia (recurrent): Secondary | ICD-10-CM | POA: Diagnosis not present

## 2015-12-16 DIAGNOSIS — Z720 Tobacco use: Secondary | ICD-10-CM | POA: Diagnosis not present

## 2015-12-16 DIAGNOSIS — F339 Major depressive disorder, recurrent, unspecified: Secondary | ICD-10-CM | POA: Diagnosis not present

## 2015-12-16 DIAGNOSIS — I2510 Atherosclerotic heart disease of native coronary artery without angina pectoris: Secondary | ICD-10-CM | POA: Diagnosis not present

## 2015-12-17 DIAGNOSIS — Z23 Encounter for immunization: Secondary | ICD-10-CM | POA: Diagnosis not present

## 2015-12-17 DIAGNOSIS — I272 Other secondary pulmonary hypertension: Secondary | ICD-10-CM | POA: Diagnosis not present

## 2015-12-17 DIAGNOSIS — I2510 Atherosclerotic heart disease of native coronary artery without angina pectoris: Secondary | ICD-10-CM | POA: Diagnosis not present

## 2015-12-17 DIAGNOSIS — Z720 Tobacco use: Secondary | ICD-10-CM | POA: Diagnosis not present

## 2015-12-17 DIAGNOSIS — J918 Pleural effusion in other conditions classified elsewhere: Secondary | ICD-10-CM | POA: Diagnosis not present

## 2015-12-17 DIAGNOSIS — F339 Major depressive disorder, recurrent, unspecified: Secondary | ICD-10-CM | POA: Diagnosis not present

## 2015-12-17 DIAGNOSIS — Z8701 Personal history of pneumonia (recurrent): Secondary | ICD-10-CM | POA: Diagnosis not present

## 2015-12-20 DIAGNOSIS — J918 Pleural effusion in other conditions classified elsewhere: Secondary | ICD-10-CM | POA: Diagnosis not present

## 2015-12-20 DIAGNOSIS — F339 Major depressive disorder, recurrent, unspecified: Secondary | ICD-10-CM | POA: Diagnosis not present

## 2015-12-20 DIAGNOSIS — Z8701 Personal history of pneumonia (recurrent): Secondary | ICD-10-CM | POA: Diagnosis not present

## 2015-12-20 DIAGNOSIS — I272 Other secondary pulmonary hypertension: Secondary | ICD-10-CM | POA: Diagnosis not present

## 2015-12-20 DIAGNOSIS — I2510 Atherosclerotic heart disease of native coronary artery without angina pectoris: Secondary | ICD-10-CM | POA: Diagnosis not present

## 2015-12-20 DIAGNOSIS — Z720 Tobacco use: Secondary | ICD-10-CM | POA: Diagnosis not present

## 2015-12-21 DIAGNOSIS — J918 Pleural effusion in other conditions classified elsewhere: Secondary | ICD-10-CM | POA: Diagnosis not present

## 2015-12-21 DIAGNOSIS — Z720 Tobacco use: Secondary | ICD-10-CM | POA: Diagnosis not present

## 2015-12-21 DIAGNOSIS — Z8701 Personal history of pneumonia (recurrent): Secondary | ICD-10-CM | POA: Diagnosis not present

## 2015-12-21 DIAGNOSIS — I272 Other secondary pulmonary hypertension: Secondary | ICD-10-CM | POA: Diagnosis not present

## 2015-12-21 DIAGNOSIS — I2510 Atherosclerotic heart disease of native coronary artery without angina pectoris: Secondary | ICD-10-CM | POA: Diagnosis not present

## 2015-12-21 DIAGNOSIS — F339 Major depressive disorder, recurrent, unspecified: Secondary | ICD-10-CM | POA: Diagnosis not present

## 2015-12-22 DIAGNOSIS — I272 Other secondary pulmonary hypertension: Secondary | ICD-10-CM | POA: Diagnosis not present

## 2015-12-22 DIAGNOSIS — Z720 Tobacco use: Secondary | ICD-10-CM | POA: Diagnosis not present

## 2015-12-22 DIAGNOSIS — Z8701 Personal history of pneumonia (recurrent): Secondary | ICD-10-CM | POA: Diagnosis not present

## 2015-12-22 DIAGNOSIS — J918 Pleural effusion in other conditions classified elsewhere: Secondary | ICD-10-CM | POA: Diagnosis not present

## 2015-12-22 DIAGNOSIS — F339 Major depressive disorder, recurrent, unspecified: Secondary | ICD-10-CM | POA: Diagnosis not present

## 2015-12-22 DIAGNOSIS — I2510 Atherosclerotic heart disease of native coronary artery without angina pectoris: Secondary | ICD-10-CM | POA: Diagnosis not present

## 2015-12-23 DIAGNOSIS — J918 Pleural effusion in other conditions classified elsewhere: Secondary | ICD-10-CM | POA: Diagnosis not present

## 2015-12-23 DIAGNOSIS — I2510 Atherosclerotic heart disease of native coronary artery without angina pectoris: Secondary | ICD-10-CM | POA: Diagnosis not present

## 2015-12-23 DIAGNOSIS — Z8701 Personal history of pneumonia (recurrent): Secondary | ICD-10-CM | POA: Diagnosis not present

## 2015-12-23 DIAGNOSIS — I272 Other secondary pulmonary hypertension: Secondary | ICD-10-CM | POA: Diagnosis not present

## 2015-12-23 DIAGNOSIS — F339 Major depressive disorder, recurrent, unspecified: Secondary | ICD-10-CM | POA: Diagnosis not present

## 2015-12-23 DIAGNOSIS — Z720 Tobacco use: Secondary | ICD-10-CM | POA: Diagnosis not present

## 2015-12-24 DIAGNOSIS — Z8701 Personal history of pneumonia (recurrent): Secondary | ICD-10-CM | POA: Diagnosis not present

## 2015-12-24 DIAGNOSIS — Z720 Tobacco use: Secondary | ICD-10-CM | POA: Diagnosis not present

## 2015-12-24 DIAGNOSIS — F339 Major depressive disorder, recurrent, unspecified: Secondary | ICD-10-CM | POA: Diagnosis not present

## 2015-12-24 DIAGNOSIS — J918 Pleural effusion in other conditions classified elsewhere: Secondary | ICD-10-CM | POA: Diagnosis not present

## 2015-12-24 DIAGNOSIS — I272 Other secondary pulmonary hypertension: Secondary | ICD-10-CM | POA: Diagnosis not present

## 2015-12-24 DIAGNOSIS — I2510 Atherosclerotic heart disease of native coronary artery without angina pectoris: Secondary | ICD-10-CM | POA: Diagnosis not present

## 2015-12-26 DIAGNOSIS — F339 Major depressive disorder, recurrent, unspecified: Secondary | ICD-10-CM | POA: Diagnosis not present

## 2015-12-26 DIAGNOSIS — Z720 Tobacco use: Secondary | ICD-10-CM | POA: Diagnosis not present

## 2015-12-26 DIAGNOSIS — I272 Other secondary pulmonary hypertension: Secondary | ICD-10-CM | POA: Diagnosis not present

## 2015-12-26 DIAGNOSIS — Z8701 Personal history of pneumonia (recurrent): Secondary | ICD-10-CM | POA: Diagnosis not present

## 2015-12-26 DIAGNOSIS — I2510 Atherosclerotic heart disease of native coronary artery without angina pectoris: Secondary | ICD-10-CM | POA: Diagnosis not present

## 2015-12-26 DIAGNOSIS — J918 Pleural effusion in other conditions classified elsewhere: Secondary | ICD-10-CM | POA: Diagnosis not present

## 2015-12-27 DIAGNOSIS — Z8701 Personal history of pneumonia (recurrent): Secondary | ICD-10-CM | POA: Diagnosis not present

## 2015-12-27 DIAGNOSIS — I272 Other secondary pulmonary hypertension: Secondary | ICD-10-CM | POA: Diagnosis not present

## 2015-12-27 DIAGNOSIS — F339 Major depressive disorder, recurrent, unspecified: Secondary | ICD-10-CM | POA: Diagnosis not present

## 2015-12-27 DIAGNOSIS — J918 Pleural effusion in other conditions classified elsewhere: Secondary | ICD-10-CM | POA: Diagnosis not present

## 2015-12-27 DIAGNOSIS — I2510 Atherosclerotic heart disease of native coronary artery without angina pectoris: Secondary | ICD-10-CM | POA: Diagnosis not present

## 2015-12-27 DIAGNOSIS — Z720 Tobacco use: Secondary | ICD-10-CM | POA: Diagnosis not present

## 2015-12-29 DIAGNOSIS — F339 Major depressive disorder, recurrent, unspecified: Secondary | ICD-10-CM | POA: Diagnosis not present

## 2015-12-29 DIAGNOSIS — Z720 Tobacco use: Secondary | ICD-10-CM | POA: Diagnosis not present

## 2015-12-29 DIAGNOSIS — Z8701 Personal history of pneumonia (recurrent): Secondary | ICD-10-CM | POA: Diagnosis not present

## 2015-12-29 DIAGNOSIS — I2510 Atherosclerotic heart disease of native coronary artery without angina pectoris: Secondary | ICD-10-CM | POA: Diagnosis not present

## 2015-12-29 DIAGNOSIS — I272 Other secondary pulmonary hypertension: Secondary | ICD-10-CM | POA: Diagnosis not present

## 2015-12-29 DIAGNOSIS — J918 Pleural effusion in other conditions classified elsewhere: Secondary | ICD-10-CM | POA: Diagnosis not present

## 2016-01-03 DIAGNOSIS — Z720 Tobacco use: Secondary | ICD-10-CM | POA: Diagnosis not present

## 2016-01-03 DIAGNOSIS — F339 Major depressive disorder, recurrent, unspecified: Secondary | ICD-10-CM | POA: Diagnosis not present

## 2016-01-03 DIAGNOSIS — I272 Other secondary pulmonary hypertension: Secondary | ICD-10-CM | POA: Diagnosis not present

## 2016-01-03 DIAGNOSIS — J918 Pleural effusion in other conditions classified elsewhere: Secondary | ICD-10-CM | POA: Diagnosis not present

## 2016-01-03 DIAGNOSIS — I2510 Atherosclerotic heart disease of native coronary artery without angina pectoris: Secondary | ICD-10-CM | POA: Diagnosis not present

## 2016-01-03 DIAGNOSIS — Z8701 Personal history of pneumonia (recurrent): Secondary | ICD-10-CM | POA: Diagnosis not present

## 2016-01-05 DIAGNOSIS — I272 Other secondary pulmonary hypertension: Secondary | ICD-10-CM | POA: Diagnosis not present

## 2016-01-05 DIAGNOSIS — Z720 Tobacco use: Secondary | ICD-10-CM | POA: Diagnosis not present

## 2016-01-05 DIAGNOSIS — F339 Major depressive disorder, recurrent, unspecified: Secondary | ICD-10-CM | POA: Diagnosis not present

## 2016-01-05 DIAGNOSIS — Z8701 Personal history of pneumonia (recurrent): Secondary | ICD-10-CM | POA: Diagnosis not present

## 2016-01-05 DIAGNOSIS — I2510 Atherosclerotic heart disease of native coronary artery without angina pectoris: Secondary | ICD-10-CM | POA: Diagnosis not present

## 2016-01-05 DIAGNOSIS — J918 Pleural effusion in other conditions classified elsewhere: Secondary | ICD-10-CM | POA: Diagnosis not present

## 2016-01-08 DIAGNOSIS — Z66 Do not resuscitate: Secondary | ICD-10-CM | POA: Diagnosis not present

## 2016-01-08 DIAGNOSIS — Z9981 Dependence on supplemental oxygen: Secondary | ICD-10-CM | POA: Diagnosis not present

## 2016-01-08 DIAGNOSIS — I2729 Other secondary pulmonary hypertension: Secondary | ICD-10-CM | POA: Diagnosis not present

## 2016-01-08 DIAGNOSIS — J309 Allergic rhinitis, unspecified: Secondary | ICD-10-CM | POA: Diagnosis not present

## 2016-01-08 DIAGNOSIS — J9611 Chronic respiratory failure with hypoxia: Secondary | ICD-10-CM | POA: Diagnosis not present

## 2016-01-08 DIAGNOSIS — R0602 Shortness of breath: Secondary | ICD-10-CM | POA: Diagnosis not present

## 2016-01-08 DIAGNOSIS — Z8701 Personal history of pneumonia (recurrent): Secondary | ICD-10-CM | POA: Diagnosis not present

## 2016-01-08 DIAGNOSIS — Z720 Tobacco use: Secondary | ICD-10-CM | POA: Diagnosis not present

## 2016-01-08 DIAGNOSIS — F339 Major depressive disorder, recurrent, unspecified: Secondary | ICD-10-CM | POA: Diagnosis not present

## 2016-01-08 DIAGNOSIS — J441 Chronic obstructive pulmonary disease with (acute) exacerbation: Secondary | ICD-10-CM | POA: Diagnosis not present

## 2016-01-08 DIAGNOSIS — R0902 Hypoxemia: Secondary | ICD-10-CM | POA: Diagnosis not present

## 2016-01-08 DIAGNOSIS — F419 Anxiety disorder, unspecified: Secondary | ICD-10-CM | POA: Diagnosis not present

## 2016-01-08 DIAGNOSIS — I10 Essential (primary) hypertension: Secondary | ICD-10-CM | POA: Diagnosis not present

## 2016-01-08 DIAGNOSIS — R531 Weakness: Secondary | ICD-10-CM | POA: Diagnosis not present

## 2016-01-08 DIAGNOSIS — I2510 Atherosclerotic heart disease of native coronary artery without angina pectoris: Secondary | ICD-10-CM | POA: Diagnosis not present

## 2016-01-08 DIAGNOSIS — J918 Pleural effusion in other conditions classified elsewhere: Secondary | ICD-10-CM | POA: Diagnosis not present

## 2016-01-08 DIAGNOSIS — R5383 Other fatigue: Secondary | ICD-10-CM | POA: Diagnosis not present

## 2016-01-08 DIAGNOSIS — R05 Cough: Secondary | ICD-10-CM | POA: Diagnosis not present

## 2016-01-10 DIAGNOSIS — J441 Chronic obstructive pulmonary disease with (acute) exacerbation: Secondary | ICD-10-CM | POA: Diagnosis not present

## 2016-01-10 DIAGNOSIS — R531 Weakness: Secondary | ICD-10-CM | POA: Diagnosis not present

## 2016-01-10 DIAGNOSIS — F419 Anxiety disorder, unspecified: Secondary | ICD-10-CM | POA: Diagnosis not present

## 2016-01-10 DIAGNOSIS — R0902 Hypoxemia: Secondary | ICD-10-CM | POA: Diagnosis not present

## 2016-01-10 DIAGNOSIS — R0602 Shortness of breath: Secondary | ICD-10-CM | POA: Diagnosis not present

## 2016-01-10 DIAGNOSIS — R5383 Other fatigue: Secondary | ICD-10-CM | POA: Diagnosis not present

## 2016-01-12 DIAGNOSIS — R531 Weakness: Secondary | ICD-10-CM | POA: Diagnosis not present

## 2016-01-12 DIAGNOSIS — R5383 Other fatigue: Secondary | ICD-10-CM | POA: Diagnosis not present

## 2016-01-12 DIAGNOSIS — F419 Anxiety disorder, unspecified: Secondary | ICD-10-CM | POA: Diagnosis not present

## 2016-01-12 DIAGNOSIS — J441 Chronic obstructive pulmonary disease with (acute) exacerbation: Secondary | ICD-10-CM | POA: Diagnosis not present

## 2016-01-12 DIAGNOSIS — R0902 Hypoxemia: Secondary | ICD-10-CM | POA: Diagnosis not present

## 2016-01-12 DIAGNOSIS — R0602 Shortness of breath: Secondary | ICD-10-CM | POA: Diagnosis not present

## 2016-01-16 DIAGNOSIS — J441 Chronic obstructive pulmonary disease with (acute) exacerbation: Secondary | ICD-10-CM | POA: Diagnosis not present

## 2016-01-16 DIAGNOSIS — R0902 Hypoxemia: Secondary | ICD-10-CM | POA: Diagnosis not present

## 2016-01-16 DIAGNOSIS — R0602 Shortness of breath: Secondary | ICD-10-CM | POA: Diagnosis not present

## 2016-01-16 DIAGNOSIS — R531 Weakness: Secondary | ICD-10-CM | POA: Diagnosis not present

## 2016-01-16 DIAGNOSIS — F419 Anxiety disorder, unspecified: Secondary | ICD-10-CM | POA: Diagnosis not present

## 2016-01-16 DIAGNOSIS — R5383 Other fatigue: Secondary | ICD-10-CM | POA: Diagnosis not present

## 2016-01-17 DIAGNOSIS — R0602 Shortness of breath: Secondary | ICD-10-CM | POA: Diagnosis not present

## 2016-01-17 DIAGNOSIS — R531 Weakness: Secondary | ICD-10-CM | POA: Diagnosis not present

## 2016-01-17 DIAGNOSIS — R5383 Other fatigue: Secondary | ICD-10-CM | POA: Diagnosis not present

## 2016-01-17 DIAGNOSIS — J441 Chronic obstructive pulmonary disease with (acute) exacerbation: Secondary | ICD-10-CM | POA: Diagnosis not present

## 2016-01-17 DIAGNOSIS — R0902 Hypoxemia: Secondary | ICD-10-CM | POA: Diagnosis not present

## 2016-01-17 DIAGNOSIS — F419 Anxiety disorder, unspecified: Secondary | ICD-10-CM | POA: Diagnosis not present

## 2016-01-19 DIAGNOSIS — R0902 Hypoxemia: Secondary | ICD-10-CM | POA: Diagnosis not present

## 2016-01-19 DIAGNOSIS — J441 Chronic obstructive pulmonary disease with (acute) exacerbation: Secondary | ICD-10-CM | POA: Diagnosis not present

## 2016-01-19 DIAGNOSIS — R5383 Other fatigue: Secondary | ICD-10-CM | POA: Diagnosis not present

## 2016-01-19 DIAGNOSIS — R531 Weakness: Secondary | ICD-10-CM | POA: Diagnosis not present

## 2016-01-19 DIAGNOSIS — F419 Anxiety disorder, unspecified: Secondary | ICD-10-CM | POA: Diagnosis not present

## 2016-01-19 DIAGNOSIS — R0602 Shortness of breath: Secondary | ICD-10-CM | POA: Diagnosis not present

## 2016-02-02 DIAGNOSIS — J449 Chronic obstructive pulmonary disease, unspecified: Secondary | ICD-10-CM | POA: Diagnosis not present

## 2016-02-02 DIAGNOSIS — Z862 Personal history of diseases of the blood and blood-forming organs and certain disorders involving the immune mechanism: Secondary | ICD-10-CM | POA: Diagnosis not present

## 2016-02-02 DIAGNOSIS — Z79899 Other long term (current) drug therapy: Secondary | ICD-10-CM | POA: Diagnosis not present

## 2016-03-27 DIAGNOSIS — Z87891 Personal history of nicotine dependence: Secondary | ICD-10-CM | POA: Diagnosis not present

## 2016-03-27 DIAGNOSIS — J449 Chronic obstructive pulmonary disease, unspecified: Secondary | ICD-10-CM | POA: Diagnosis not present

## 2016-03-27 DIAGNOSIS — F17210 Nicotine dependence, cigarettes, uncomplicated: Secondary | ICD-10-CM | POA: Diagnosis not present

## 2016-03-27 DIAGNOSIS — R0902 Hypoxemia: Secondary | ICD-10-CM | POA: Diagnosis not present

## 2016-05-17 DIAGNOSIS — Z87891 Personal history of nicotine dependence: Secondary | ICD-10-CM | POA: Diagnosis not present

## 2016-05-24 DIAGNOSIS — J449 Chronic obstructive pulmonary disease, unspecified: Secondary | ICD-10-CM | POA: Diagnosis not present

## 2016-05-24 DIAGNOSIS — F411 Generalized anxiety disorder: Secondary | ICD-10-CM | POA: Diagnosis not present

## 2016-05-24 DIAGNOSIS — Z9189 Other specified personal risk factors, not elsewhere classified: Secondary | ICD-10-CM | POA: Diagnosis not present

## 2016-06-20 DIAGNOSIS — R0902 Hypoxemia: Secondary | ICD-10-CM | POA: Diagnosis not present

## 2016-06-20 DIAGNOSIS — F17210 Nicotine dependence, cigarettes, uncomplicated: Secondary | ICD-10-CM | POA: Diagnosis not present

## 2016-06-20 DIAGNOSIS — Z7952 Long term (current) use of systemic steroids: Secondary | ICD-10-CM | POA: Diagnosis not present

## 2016-06-20 DIAGNOSIS — J449 Chronic obstructive pulmonary disease, unspecified: Secondary | ICD-10-CM | POA: Diagnosis not present

## 2016-07-13 DIAGNOSIS — F411 Generalized anxiety disorder: Secondary | ICD-10-CM | POA: Diagnosis not present

## 2016-07-13 DIAGNOSIS — J449 Chronic obstructive pulmonary disease, unspecified: Secondary | ICD-10-CM | POA: Diagnosis not present

## 2016-07-13 DIAGNOSIS — J9611 Chronic respiratory failure with hypoxia: Secondary | ICD-10-CM | POA: Diagnosis not present

## 2016-09-11 DIAGNOSIS — Z9981 Dependence on supplemental oxygen: Secondary | ICD-10-CM | POA: Diagnosis not present

## 2016-09-11 DIAGNOSIS — R51 Headache: Secondary | ICD-10-CM | POA: Diagnosis not present

## 2016-09-11 DIAGNOSIS — Z720 Tobacco use: Secondary | ICD-10-CM | POA: Diagnosis not present

## 2016-09-11 DIAGNOSIS — E875 Hyperkalemia: Secondary | ICD-10-CM | POA: Diagnosis not present

## 2016-09-11 DIAGNOSIS — T3111 Burns involving 10-19% of body surface with 10-19% third degree burns: Secondary | ICD-10-CM | POA: Diagnosis not present

## 2016-09-11 DIAGNOSIS — J9611 Chronic respiratory failure with hypoxia: Secondary | ICD-10-CM | POA: Diagnosis not present

## 2016-09-11 DIAGNOSIS — K5900 Constipation, unspecified: Secondary | ICD-10-CM | POA: Diagnosis not present

## 2016-09-11 DIAGNOSIS — T59811A Toxic effect of smoke, accidental (unintentional), initial encounter: Secondary | ICD-10-CM | POA: Diagnosis present

## 2016-09-11 DIAGNOSIS — J9622 Acute and chronic respiratory failure with hypercapnia: Secondary | ICD-10-CM | POA: Diagnosis not present

## 2016-09-11 DIAGNOSIS — T2004XA Burn of unspecified degree of nose (septum), initial encounter: Secondary | ICD-10-CM | POA: Diagnosis not present

## 2016-09-11 DIAGNOSIS — J705 Respiratory conditions due to smoke inhalation: Secondary | ICD-10-CM | POA: Diagnosis present

## 2016-09-11 DIAGNOSIS — T2132XA Burn of third degree of abdominal wall, initial encounter: Secondary | ICD-10-CM | POA: Diagnosis not present

## 2016-09-11 DIAGNOSIS — D649 Anemia, unspecified: Secondary | ICD-10-CM | POA: Diagnosis present

## 2016-09-11 DIAGNOSIS — Z4659 Encounter for fitting and adjustment of other gastrointestinal appliance and device: Secondary | ICD-10-CM | POA: Diagnosis not present

## 2016-09-11 DIAGNOSIS — T24311A Burn of third degree of right thigh, initial encounter: Secondary | ICD-10-CM | POA: Diagnosis not present

## 2016-09-11 DIAGNOSIS — T300 Burn of unspecified body region, unspecified degree: Secondary | ICD-10-CM | POA: Diagnosis not present

## 2016-09-11 DIAGNOSIS — Z9911 Dependence on respirator [ventilator] status: Secondary | ICD-10-CM | POA: Diagnosis not present

## 2016-09-11 DIAGNOSIS — T2132XD Burn of third degree of abdominal wall, subsequent encounter: Secondary | ICD-10-CM | POA: Diagnosis not present

## 2016-09-11 DIAGNOSIS — T22332A Burn of third degree of left upper arm, initial encounter: Secondary | ICD-10-CM | POA: Diagnosis present

## 2016-09-11 DIAGNOSIS — J449 Chronic obstructive pulmonary disease, unspecified: Secondary | ICD-10-CM | POA: Diagnosis not present

## 2016-09-11 DIAGNOSIS — J441 Chronic obstructive pulmonary disease with (acute) exacerbation: Secondary | ICD-10-CM | POA: Diagnosis not present

## 2016-09-11 DIAGNOSIS — Y26XXXA Exposure to smoke, fire and flames, undetermined intent, initial encounter: Secondary | ICD-10-CM | POA: Diagnosis not present

## 2016-09-11 DIAGNOSIS — T2134XD Burn of third degree of lower back, subsequent encounter: Secondary | ICD-10-CM | POA: Diagnosis not present

## 2016-09-11 DIAGNOSIS — J9602 Acute respiratory failure with hypercapnia: Secondary | ICD-10-CM | POA: Diagnosis not present

## 2016-09-11 DIAGNOSIS — F17210 Nicotine dependence, cigarettes, uncomplicated: Secondary | ICD-10-CM | POA: Diagnosis present

## 2016-09-11 DIAGNOSIS — J439 Emphysema, unspecified: Secondary | ICD-10-CM | POA: Diagnosis not present

## 2016-09-11 DIAGNOSIS — Z430 Encounter for attention to tracheostomy: Secondary | ICD-10-CM | POA: Diagnosis not present

## 2016-09-11 DIAGNOSIS — T22232A Burn of second degree of left upper arm, initial encounter: Secondary | ICD-10-CM | POA: Diagnosis not present

## 2016-09-11 DIAGNOSIS — R739 Hyperglycemia, unspecified: Secondary | ICD-10-CM | POA: Diagnosis not present

## 2016-09-11 DIAGNOSIS — D62 Acute posthemorrhagic anemia: Secondary | ICD-10-CM | POA: Diagnosis not present

## 2016-09-11 DIAGNOSIS — E440 Moderate protein-calorie malnutrition: Secondary | ICD-10-CM | POA: Diagnosis not present

## 2016-09-11 DIAGNOSIS — T2134XA Burn of third degree of lower back, initial encounter: Secondary | ICD-10-CM | POA: Diagnosis not present

## 2016-09-11 DIAGNOSIS — T24312A Burn of third degree of left thigh, initial encounter: Secondary | ICD-10-CM | POA: Diagnosis not present

## 2016-09-11 DIAGNOSIS — I10 Essential (primary) hypertension: Secondary | ICD-10-CM | POA: Diagnosis not present

## 2016-09-11 DIAGNOSIS — T3110 Burns involving 10-19% of body surface with 0% to 9% third degree burns: Secondary | ICD-10-CM | POA: Diagnosis not present

## 2016-09-11 DIAGNOSIS — T2032XA Burn of third degree of lip(s), initial encounter: Secondary | ICD-10-CM | POA: Diagnosis not present

## 2016-09-11 DIAGNOSIS — T22392A Burn of third degree of multiple sites of left shoulder and upper limb, except wrist and hand, initial encounter: Secondary | ICD-10-CM | POA: Diagnosis not present

## 2016-09-11 DIAGNOSIS — T2024XA Burn of second degree of nose (septum), initial encounter: Secondary | ICD-10-CM | POA: Diagnosis present

## 2016-09-11 DIAGNOSIS — T2230XA Burn of third degree of shoulder and upper limb, except wrist and hand, unspecified site, initial encounter: Secondary | ICD-10-CM | POA: Diagnosis not present

## 2016-09-11 DIAGNOSIS — E8809 Other disorders of plasma-protein metabolism, not elsewhere classified: Secondary | ICD-10-CM | POA: Diagnosis not present

## 2016-09-11 DIAGNOSIS — E872 Acidosis: Secondary | ICD-10-CM | POA: Diagnosis not present

## 2016-09-11 DIAGNOSIS — E778 Other disorders of glycoprotein metabolism: Secondary | ICD-10-CM | POA: Diagnosis not present

## 2016-09-11 DIAGNOSIS — Z431 Encounter for attention to gastrostomy: Secondary | ICD-10-CM | POA: Diagnosis not present

## 2016-09-11 DIAGNOSIS — Z66 Do not resuscitate: Secondary | ICD-10-CM | POA: Diagnosis not present

## 2016-09-11 DIAGNOSIS — J9621 Acute and chronic respiratory failure with hypoxia: Secondary | ICD-10-CM | POA: Diagnosis not present

## 2016-09-11 DIAGNOSIS — F329 Major depressive disorder, single episode, unspecified: Secondary | ICD-10-CM | POA: Diagnosis present

## 2016-09-11 DIAGNOSIS — T2123XA Burn of second degree of upper back, initial encounter: Secondary | ICD-10-CM | POA: Diagnosis not present

## 2016-09-11 DIAGNOSIS — E43 Unspecified severe protein-calorie malnutrition: Secondary | ICD-10-CM | POA: Diagnosis not present

## 2016-09-11 DIAGNOSIS — T2022XA Burn of second degree of lip(s), initial encounter: Secondary | ICD-10-CM | POA: Diagnosis present

## 2016-09-11 DIAGNOSIS — I700 Atherosclerosis of aorta: Secondary | ICD-10-CM | POA: Diagnosis not present

## 2016-09-11 DIAGNOSIS — J810 Acute pulmonary edema: Secondary | ICD-10-CM | POA: Diagnosis not present

## 2016-09-11 DIAGNOSIS — R6510 Systemic inflammatory response syndrome (SIRS) of non-infectious origin without acute organ dysfunction: Secondary | ICD-10-CM | POA: Diagnosis not present

## 2016-09-11 DIAGNOSIS — J90 Pleural effusion, not elsewhere classified: Secondary | ICD-10-CM | POA: Diagnosis not present

## 2016-09-11 DIAGNOSIS — Z90710 Acquired absence of both cervix and uterus: Secondary | ICD-10-CM | POA: Diagnosis not present

## 2016-09-11 DIAGNOSIS — I517 Cardiomegaly: Secondary | ICD-10-CM | POA: Diagnosis not present

## 2016-09-11 DIAGNOSIS — T2130XA Burn of third degree of trunk, unspecified site, initial encounter: Secondary | ICD-10-CM | POA: Diagnosis not present

## 2016-09-11 DIAGNOSIS — T2131XA Burn of third degree of chest wall, initial encounter: Secondary | ICD-10-CM | POA: Diagnosis not present

## 2016-09-11 DIAGNOSIS — T2133XA Burn of third degree of upper back, initial encounter: Secondary | ICD-10-CM | POA: Diagnosis present

## 2016-09-11 DIAGNOSIS — T2122XA Burn of second degree of abdominal wall, initial encounter: Secondary | ICD-10-CM | POA: Diagnosis not present

## 2016-09-11 DIAGNOSIS — R000 Tachycardia, unspecified: Secondary | ICD-10-CM | POA: Diagnosis not present

## 2016-09-11 DIAGNOSIS — R918 Other nonspecific abnormal finding of lung field: Secondary | ICD-10-CM | POA: Diagnosis not present

## 2016-09-11 DIAGNOSIS — F419 Anxiety disorder, unspecified: Secondary | ICD-10-CM | POA: Diagnosis present

## 2016-09-11 DIAGNOSIS — W408XXA Explosion of other specified explosive materials, initial encounter: Secondary | ICD-10-CM | POA: Diagnosis not present

## 2016-09-11 DIAGNOSIS — Y999 Unspecified external cause status: Secondary | ICD-10-CM | POA: Diagnosis not present

## 2016-09-11 DIAGNOSIS — T2020XA Burn of second degree of head, face, and neck, unspecified site, initial encounter: Secondary | ICD-10-CM | POA: Diagnosis not present

## 2016-09-11 DIAGNOSIS — Z452 Encounter for adjustment and management of vascular access device: Secondary | ICD-10-CM | POA: Diagnosis not present

## 2016-10-07 DEATH — deceased

## 2017-08-27 IMAGING — CR DG CHEST 1V PORT
1 series · 1 of 1 positions shown · non-contrast
Comparison: 11/30/2014

CLINICAL DATA: Dyspnea, onset tonight.

EXAM:
PORTABLE CHEST 1 VIEW

[AP]
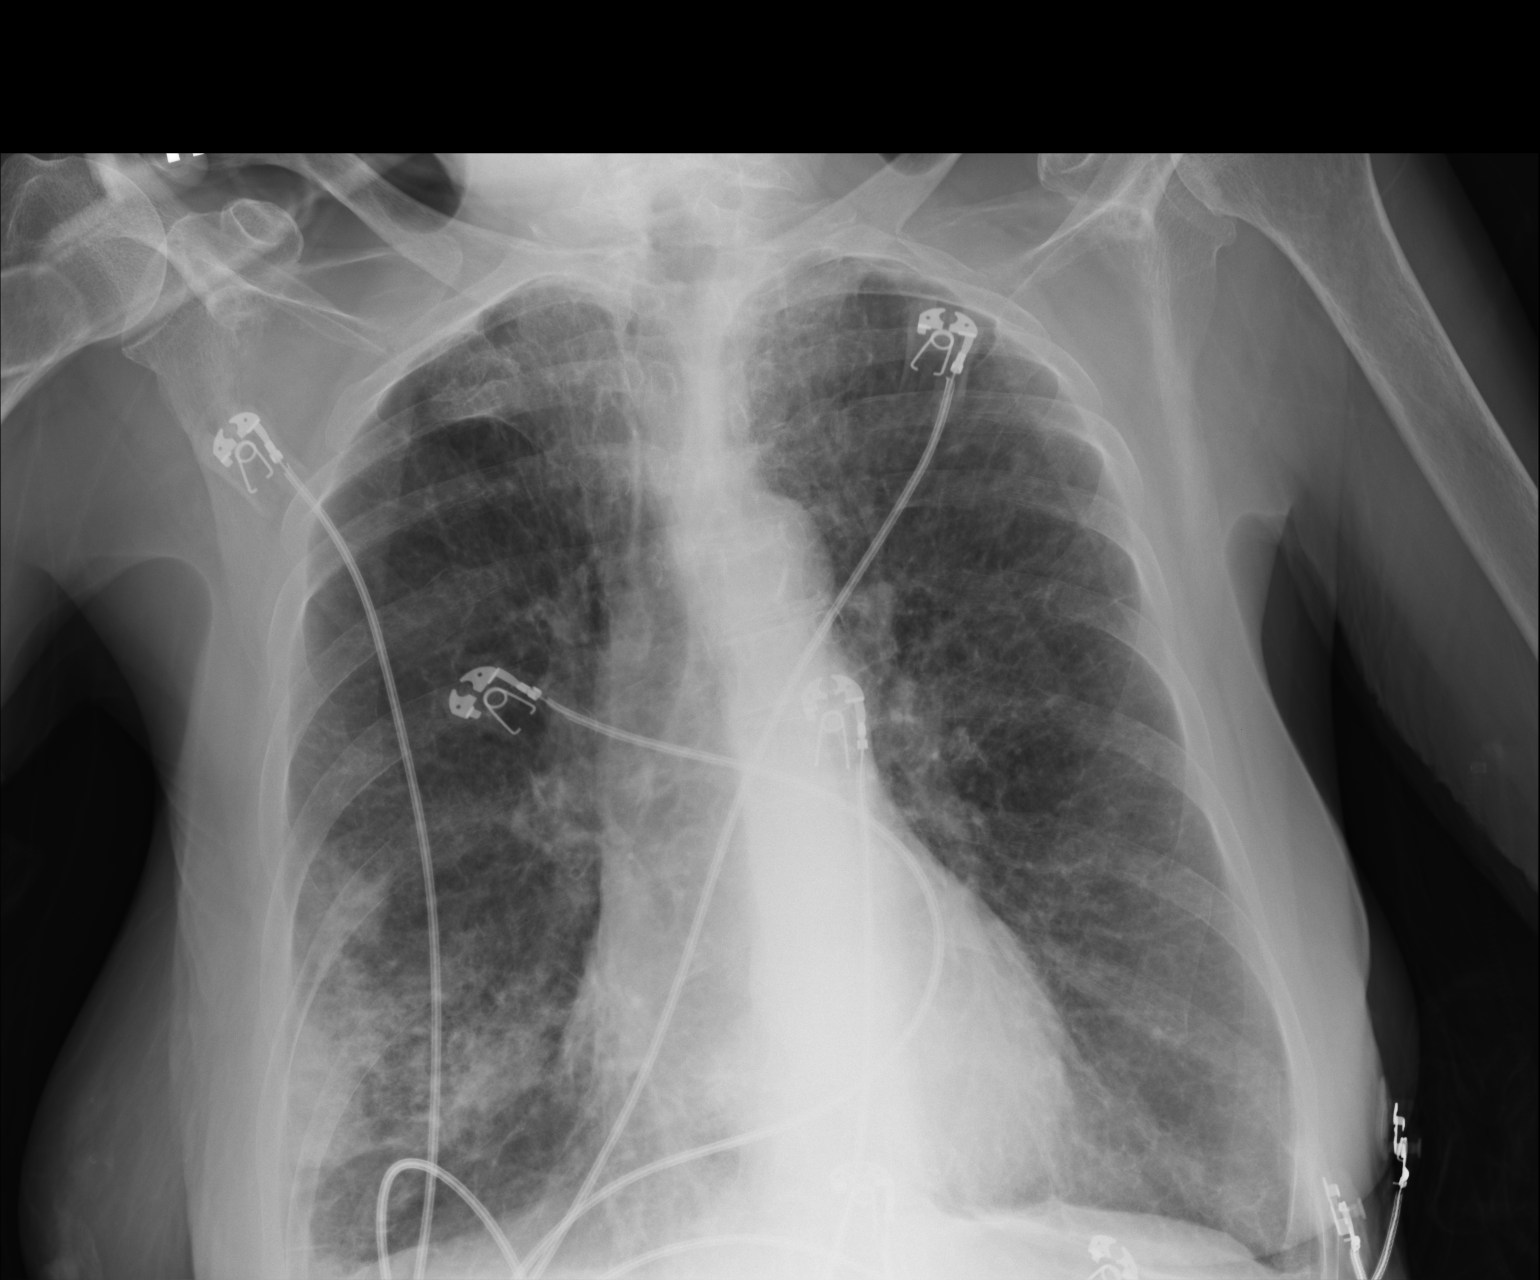

[1 of 1 positions shown; findings below may reference images not displayed]

FINDINGS: There is patchy airspace opacity in the lateral right base,
superimposed on marked hyperinflation and emphysematous changes.
Fibrotic appearing interstitial coarsening is present. No large
effusions. No pneumothorax.
IMPRESSION: Right base infiltrate superimposed on severe COPD, likely pneumonia.

## 2017-08-29 IMAGING — CT CT ANGIO CHEST
2 of 6 series · 18 of 36 positions shown · IV contrast (Omni 300)
Comparison: Chest radiograph dated 03/11/2015 and CT dated
08/15/2007

CLINICAL DATA: 64-year-old female

EXAM:
CT ANGIOGRAPHY CHEST WITH CONTRAST
TECHNIQUE: Multidetector CT imaging of the chest was performed using the
standard protocol during bolus administration of intravenous
contrast. Multiplanar CT image reconstructions and MIPs were
obtained to evaluate the vascular anatomy.
CONTRAST:  80mL OMNIPAQUE IOHEXOL 350 MG/ML SOLN

[Series 6: pe thins · axial · 0.55mm/px · z∈[+604,+894]mm · 17 of 642 slices shown]
[im 31/642  lung]
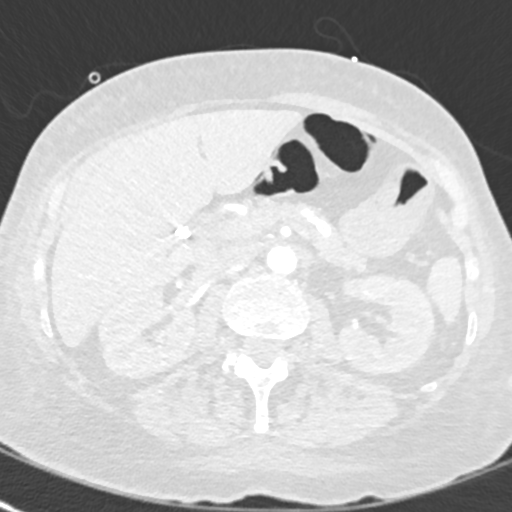
[im 62/642  mediastinal]
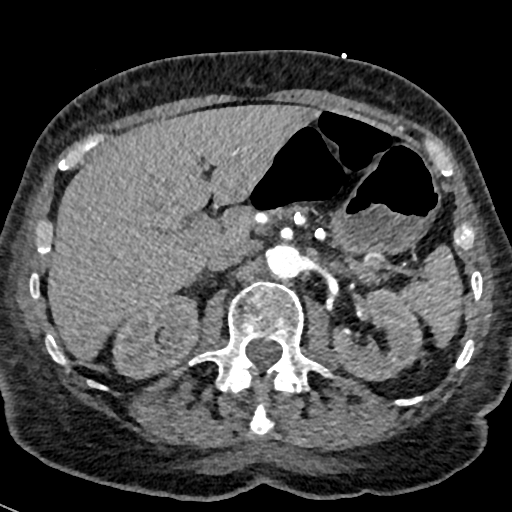
[im 92/642  lung]
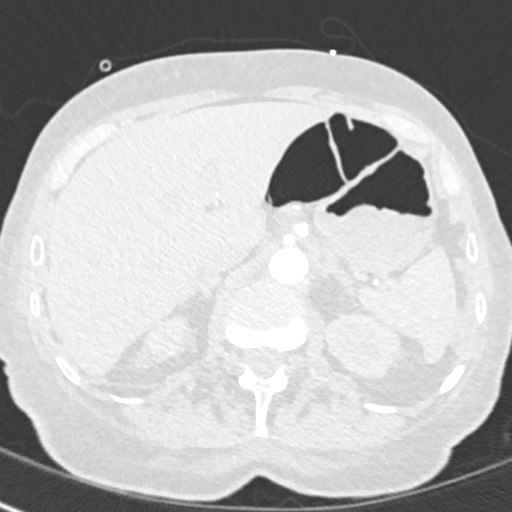
[im 153/642  mediastinal]
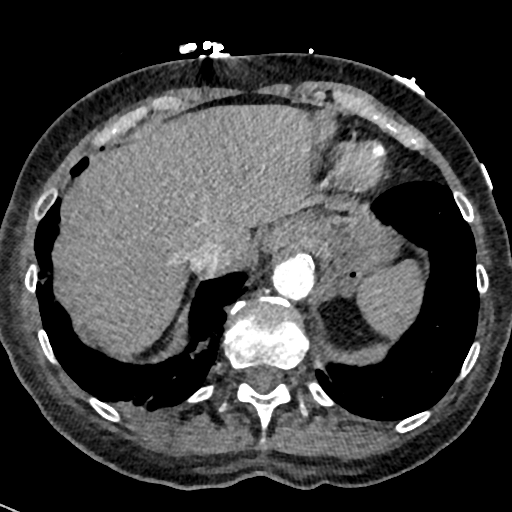
[im 184/642  lung]
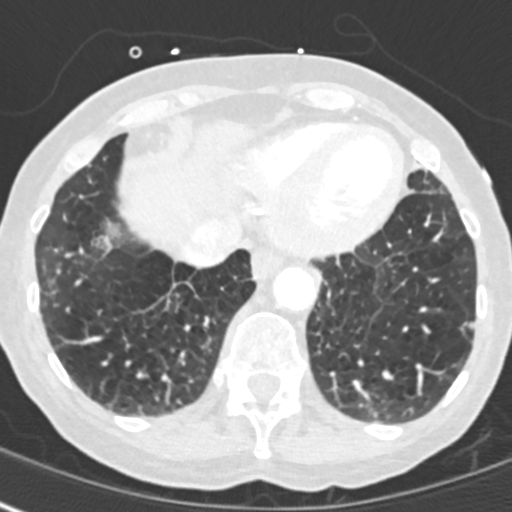
[im 214/642  mediastinal]
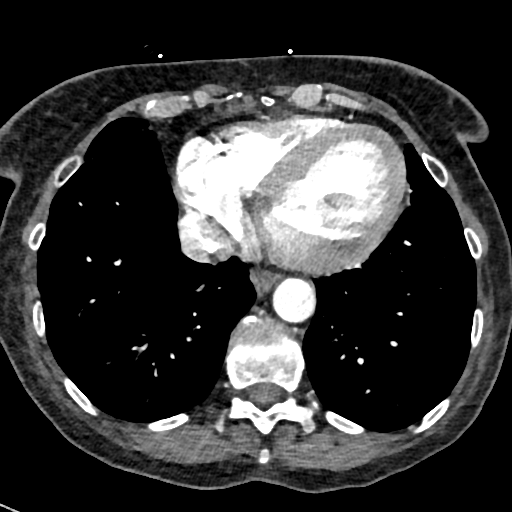
[im 245/642  lung]
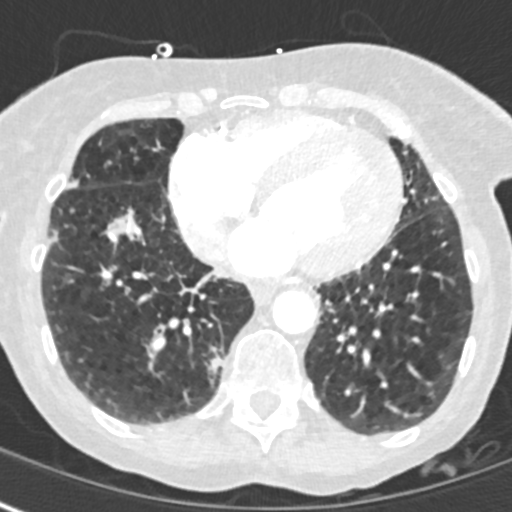
[im 275/642  mediastinal]
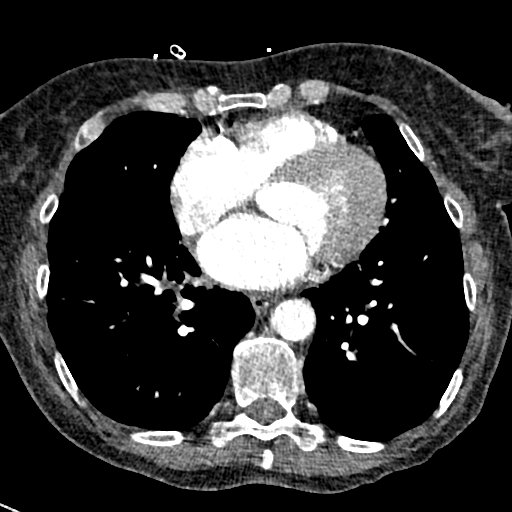
[im 336/642  lung]
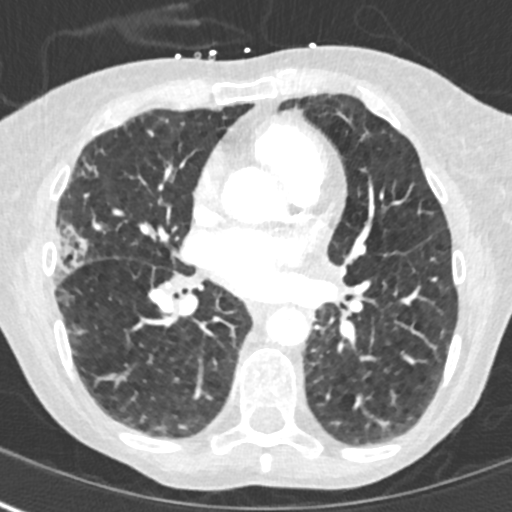
[im 367/642  mediastinal]
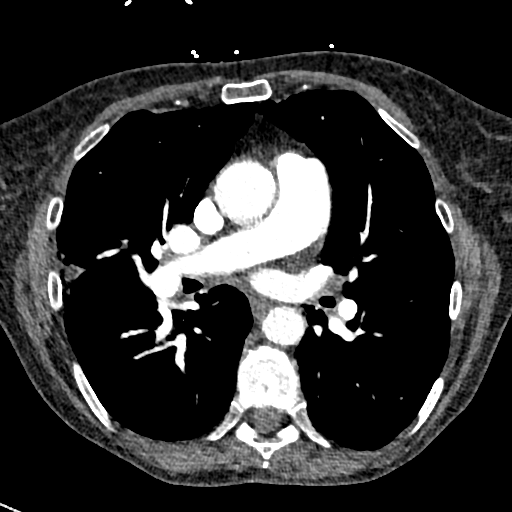
[im 397/642  lung]
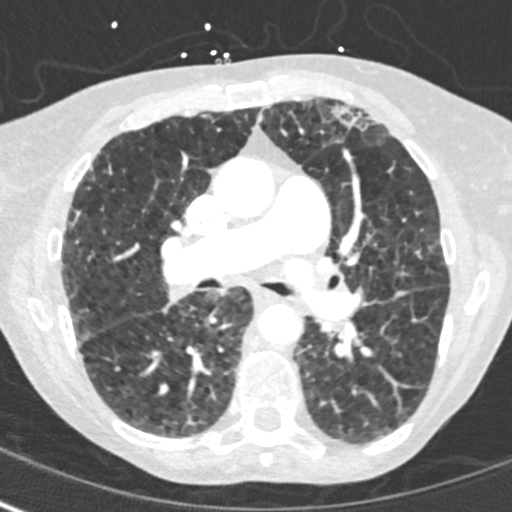
[im 428/642  mediastinal]
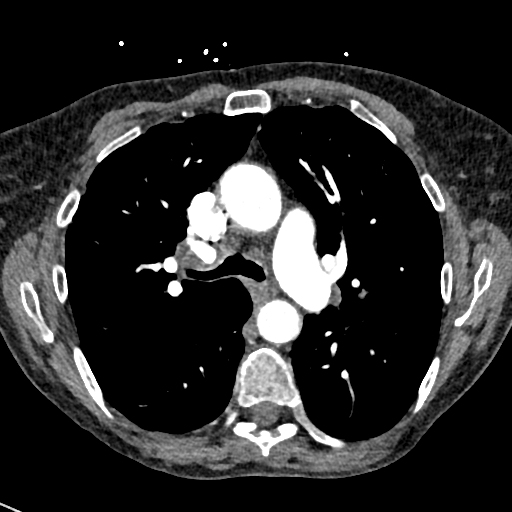
[im 458/642  lung]
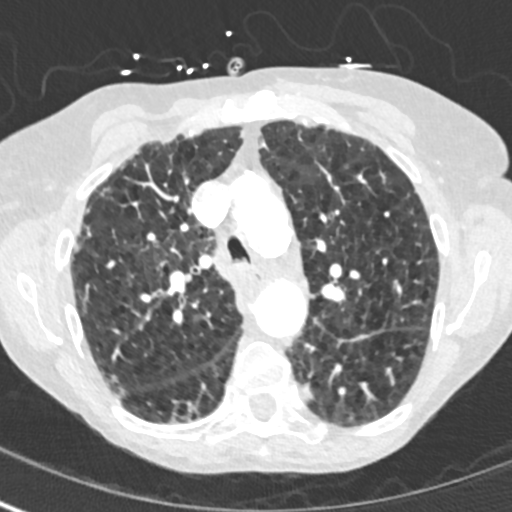
[im 489/642  mediastinal]
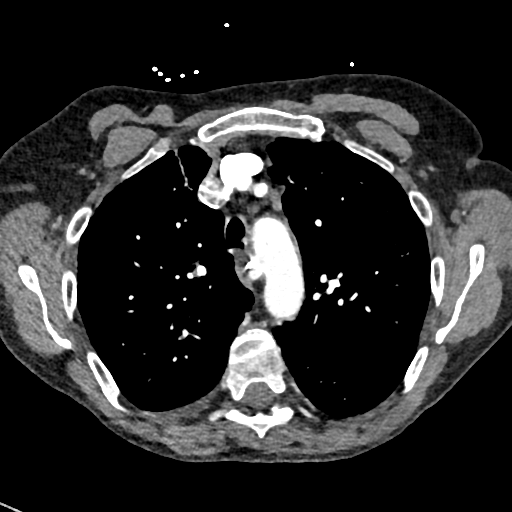
[im 550/642  lung]
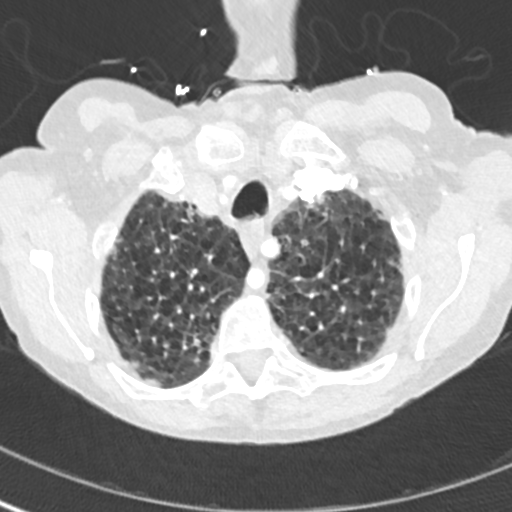
[im 580/642  mediastinal]
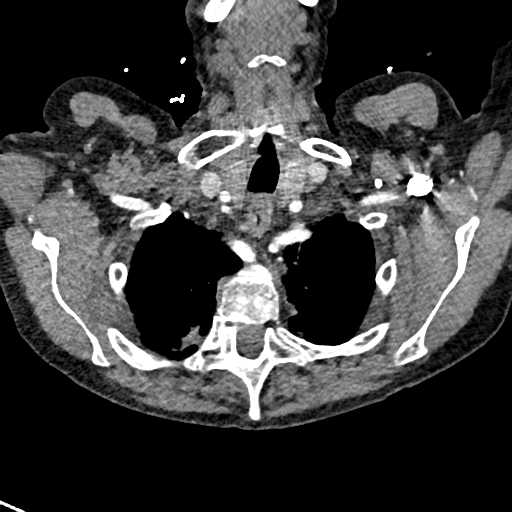
[im 611/642  lung]
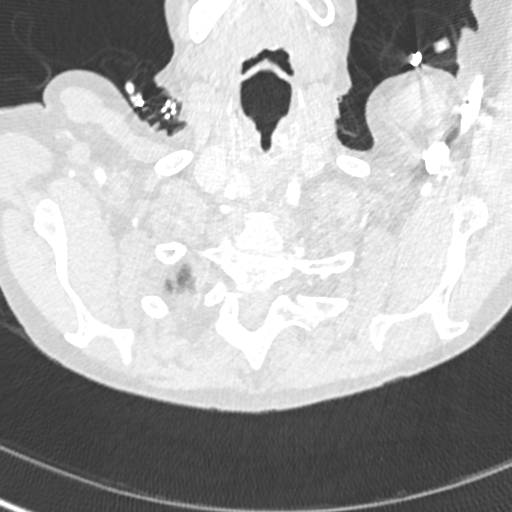

[Series 7: pe 2mm cor · coronal · 0.62mm/px · 1 of 121 slices shown]
[im 61/121  mediastinal]
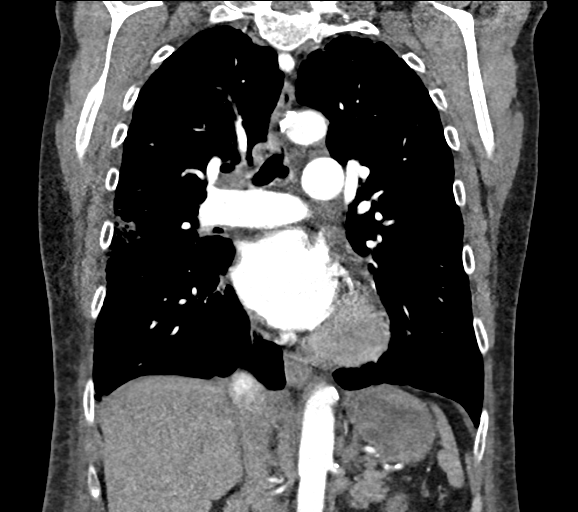

[18 of 36 positions shown; findings below may reference images not displayed]

FINDINGS: There is emphysematous changes of the lungs. Small airspace opacity
predominantly involving the right lower lobe are concerning for
developing pneumonia. Underlying mass is less likely but not
excluded. Clinical correlation and follow-up to resolution is
recommended. There is no pleural effusion or pneumothorax. Areas of
subpleural reticular densities in the upper lobes may be related to
chronic changes and scarring or represent pneumonia. The central
airways are patent.

There is mild atherosclerotic calcification of the thoracic aorta.
There is prominence of the main pulmonary trunk indicative of a
degree of pulmonary hypertension. No CT evidence of pulmonary
embolism. There is right hilar adenopathy. No mediastinal
adenopathy. Mild cardiomegaly. No pericardial effusion. There is
coronary vascular calcification. The visualized esophagus and
thyroid gland are grossly unremarkable.

There is mild diffuse subcutaneous soft tissue stranding of the
chest wall. There is degenerative changes of the spine. No acute
fracture. The visualized upper abdomen is grossly unremarkable.
There is age indeterminate compression fracture of the superior
endplate of the L1 vertebra with approximately 10% loss of vertebral
body height. Clinical correlation is recommended.

Review of the MIP images confirms the above findings.
IMPRESSION: No CT evidence of pulmonary embolism.

A small scattered patchy airspace opacity predominantly involving
the right lower lobe most compatible with pneumonia. Clinical
correlation and follow-up recommended.
